# Patient Record
Sex: Male | Born: 1985 | Race: Black or African American | Hispanic: No | Marital: Single | Smoking: Current every day smoker
Health system: Southern US, Community
[De-identification: ages and names within clinical notes are randomized; demographics above are authoritative.]

## PROBLEM LIST (undated history)

## (undated) HISTORY — PX: HERNIA REPAIR: SHX51

---

## 2011-10-28 ENCOUNTER — Emergency Department: Payer: Self-pay | Admitting: Emergency Medicine

## 2013-10-18 IMAGING — CR DG CERVICAL SPINE - 1 VIEW
1 series · 1 of 1 positions shown · non-contrast
Comparison: none

REASON FOR EXAM: pain
COMMENTS:

PROCEDURE:     DXR - DXR CERVICAL SPINE ONE VIEW ONLY  - October 28, 2011  [DATE]
RESULT:     Comparison: None

[lat]
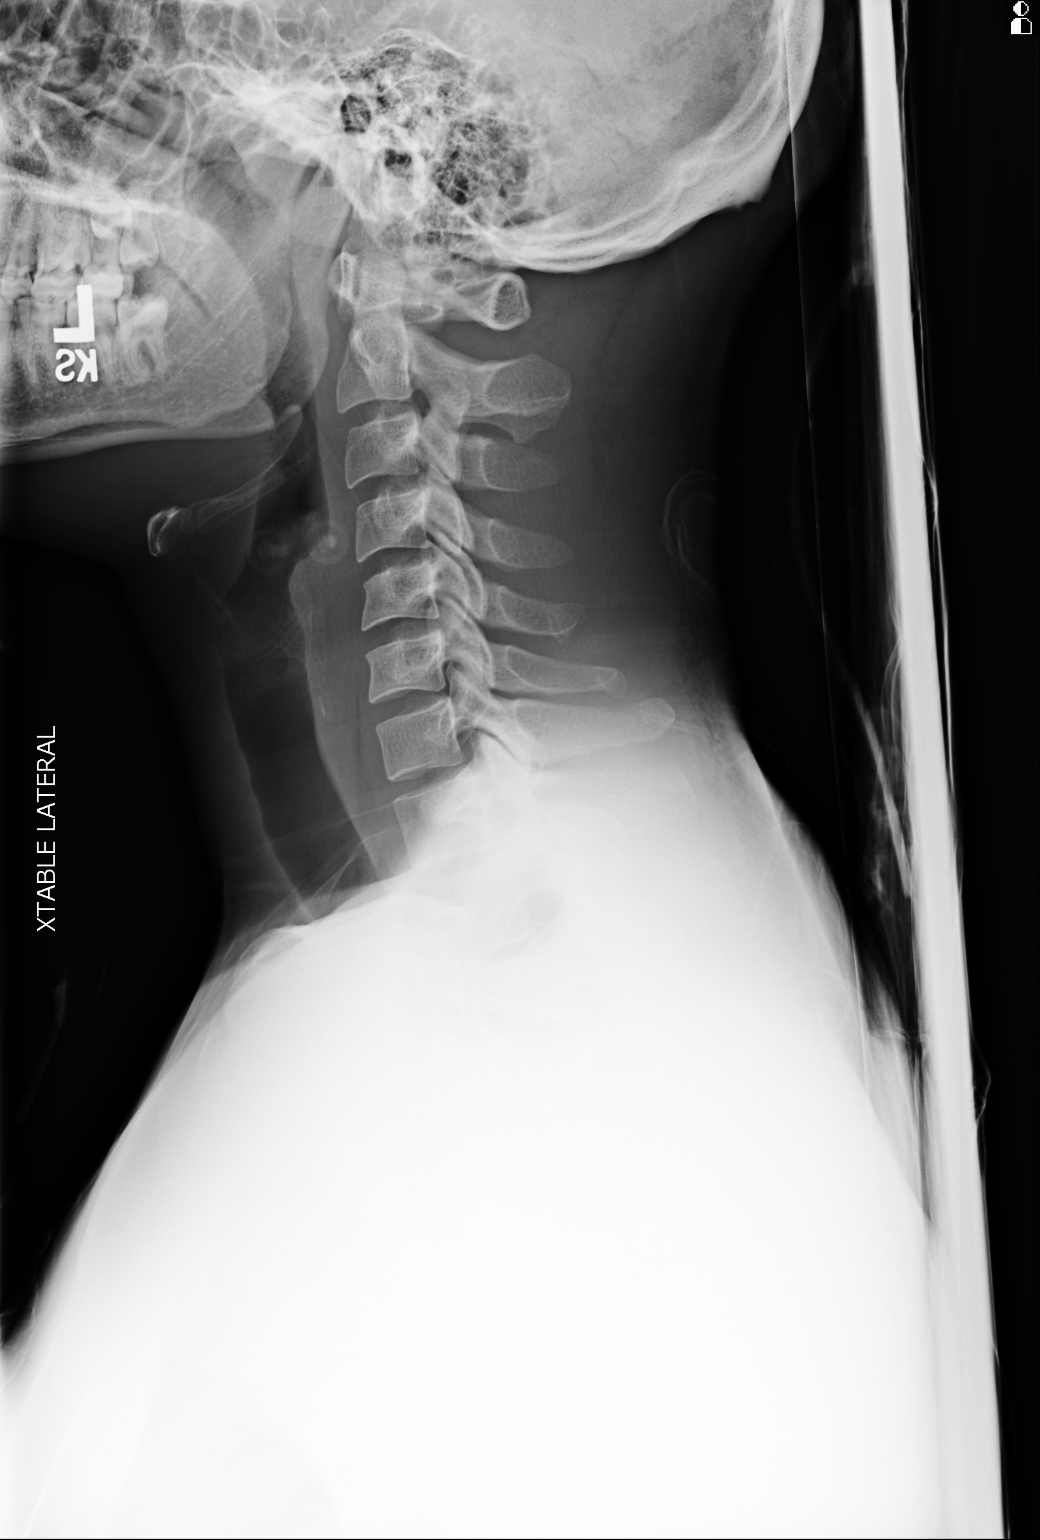

[1 of 1 positions shown; findings below may reference images not displayed]

FINDINGS: Crosstable lateral view of the cervical spine is provided.

The vertebral body heights are maintained. The alignment is anatomic. There
is no acute fracture or static listhesis. The disc spaces are maintained.
IMPRESSION: 1. No acute osseous abnormality of the cervical spine on a single crosstable
lateral view.

[REDACTED]

## 2013-10-18 IMAGING — CR DG LUMBAR SPINE 1V
1 series · 1 of 1 positions shown · non-contrast
Comparison: none

REASON FOR EXAM: pain
COMMENTS:

PROCEDURE:     DXR - DXR LUMBAR SPINE ONE VIEW ONLY  - October 28, 2011  [DATE]
RESULT:     Comparison: None

[lat]
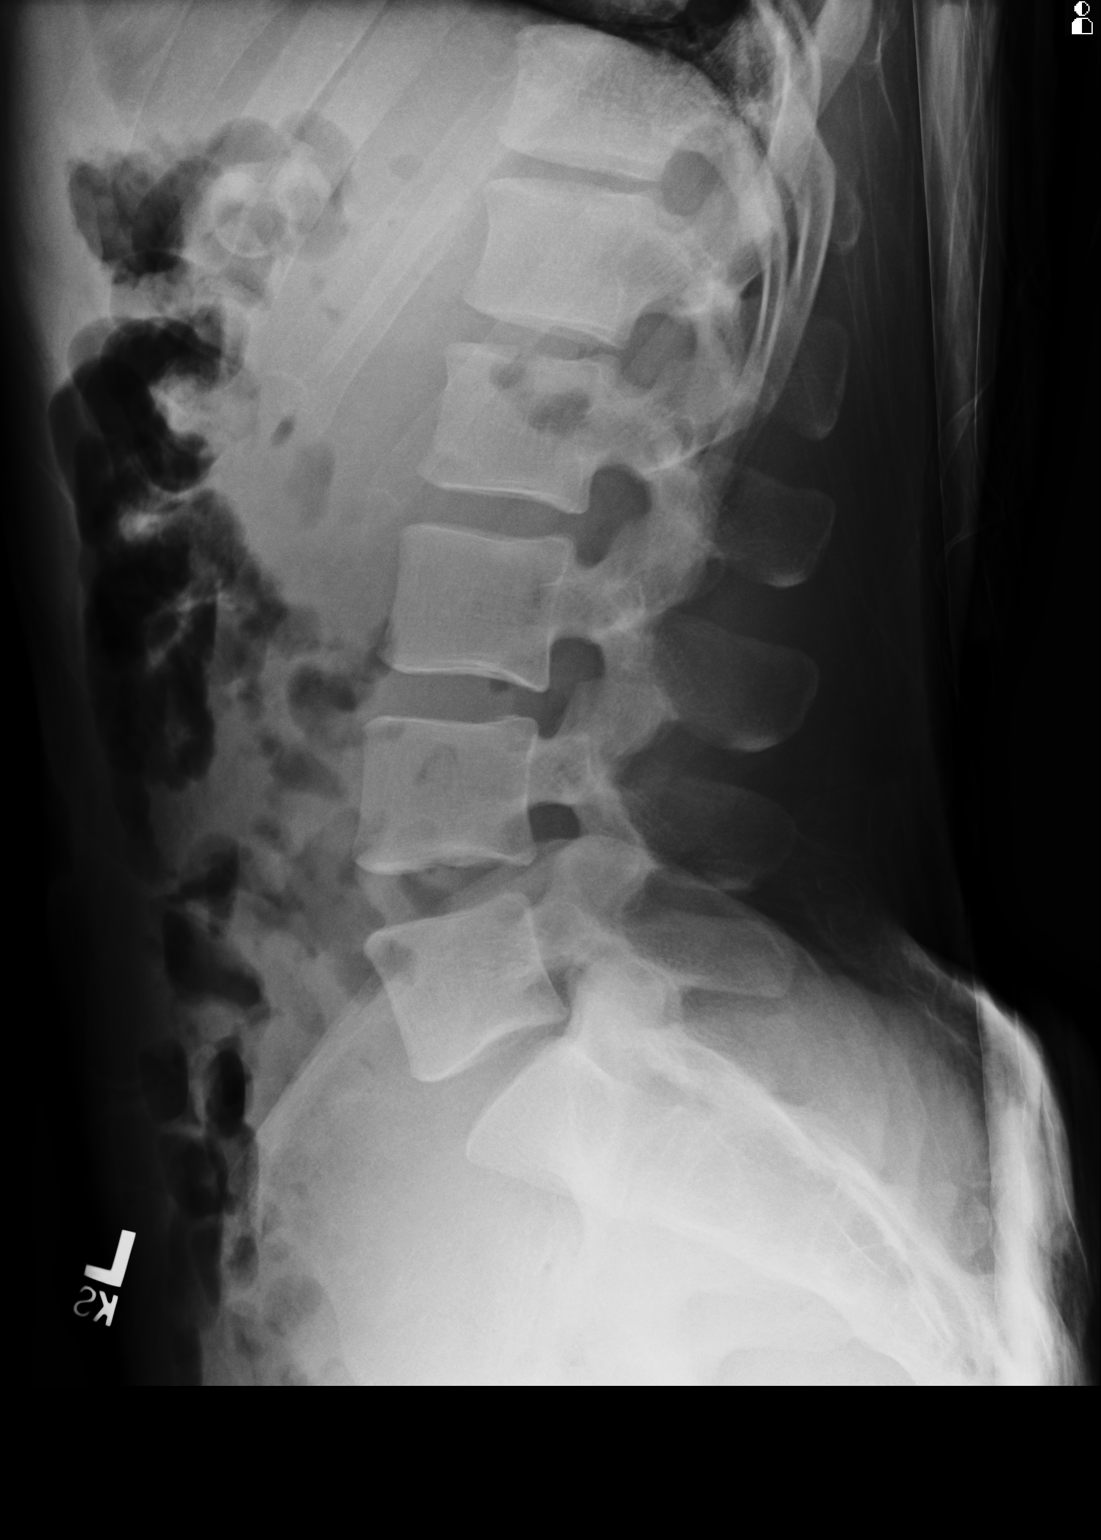

[1 of 1 positions shown; findings below may reference images not displayed]

FINDINGS: Crosstable lateral view of the lumbar spine is provided.

The vertebral body heights are maintained. The alignment is anatomic. There
is no spondylolysis. There is no acute fracture or static listhesis. The
disc spaces are maintained.
IMPRESSION: 1. No acute osseous abnormality of the lumbar spine on a single crosstable
lateral view.

[REDACTED]

## 2013-10-18 IMAGING — CR CERVICAL SPINE - COMPLETE 4+ VIEW
1 series · 7 of 7 positions shown · non-contrast
Comparison: None

REASON FOR EXAM: pain
COMMENTS:

PROCEDURE:     DXR - DXR CERVICAL SPINE COMPLETE  - October 28, 2011  [DATE]
RESULT:     History: Pain

[Series 1: lat · 0.17mm/px · 7 of 7 slices shown]
[im 1/7]
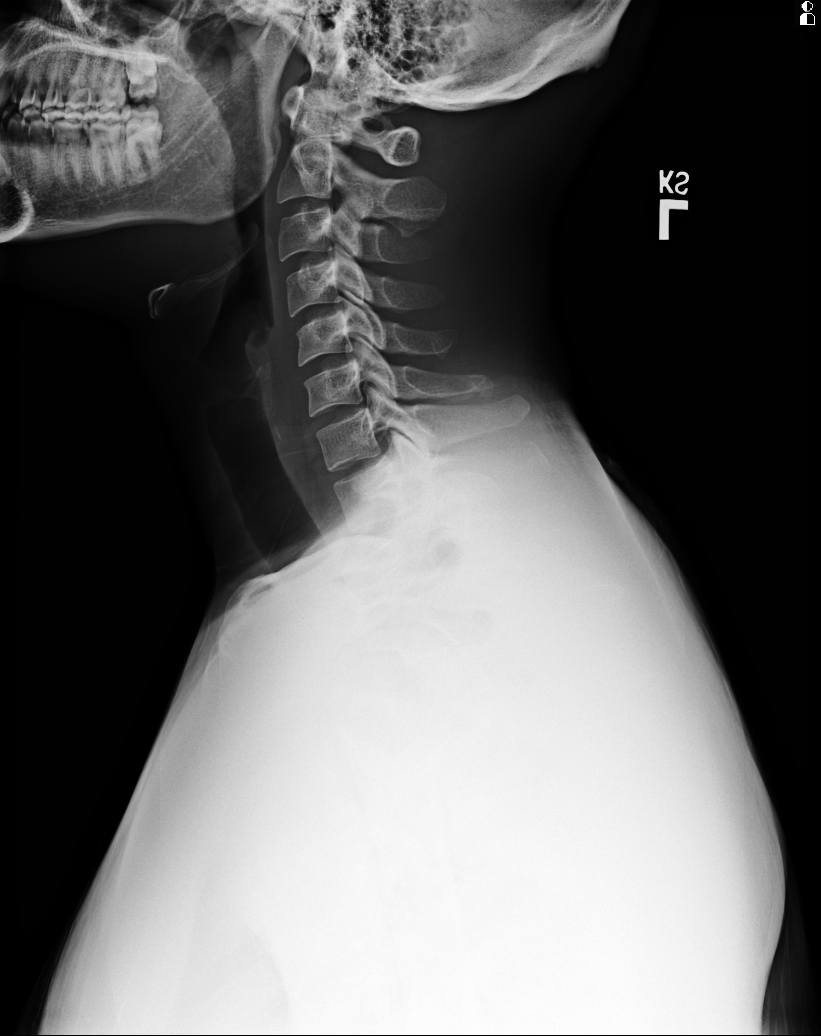
[im 2/7]
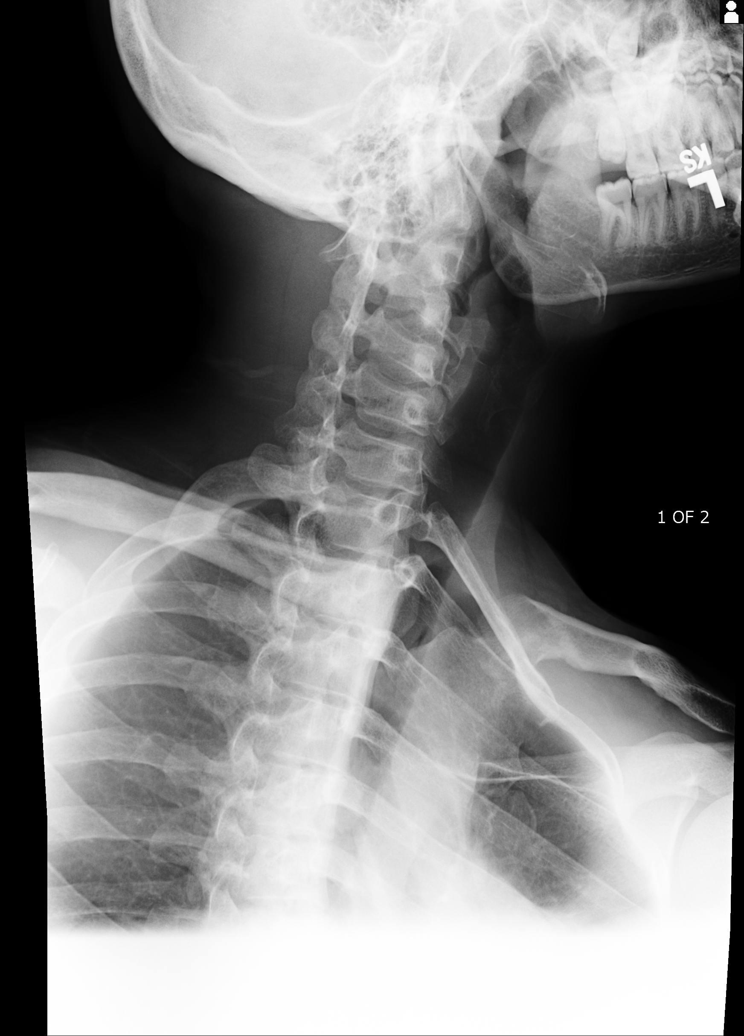
[im 3/7]
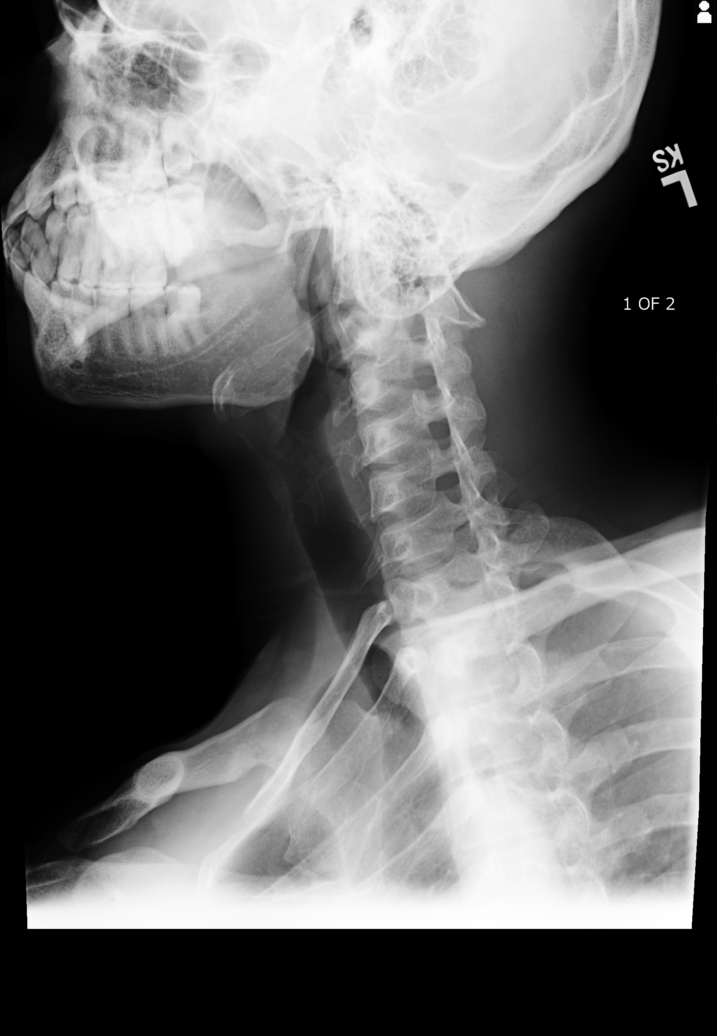
[im 4/7]
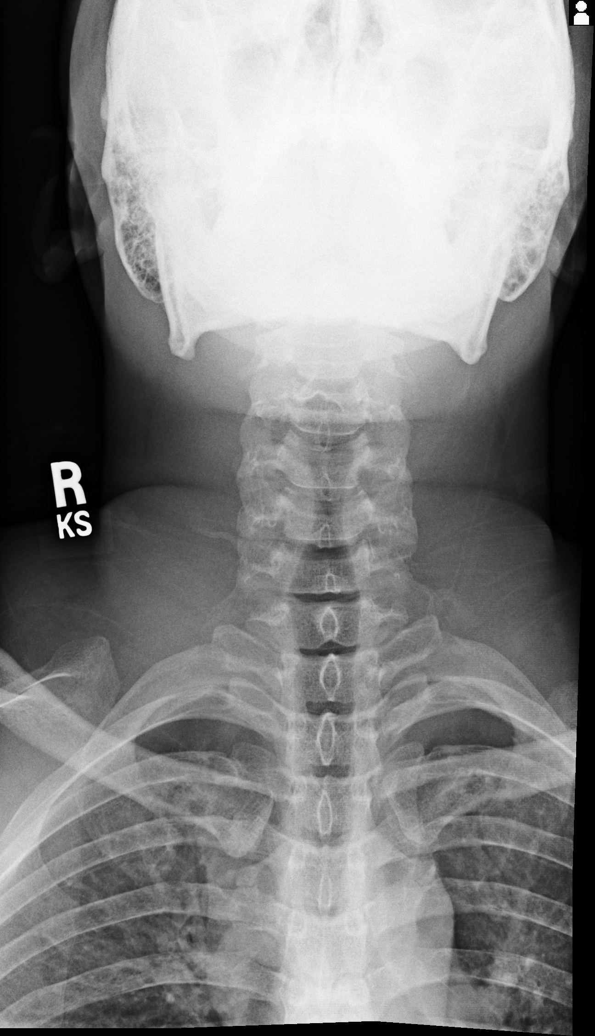
[im 5/7]
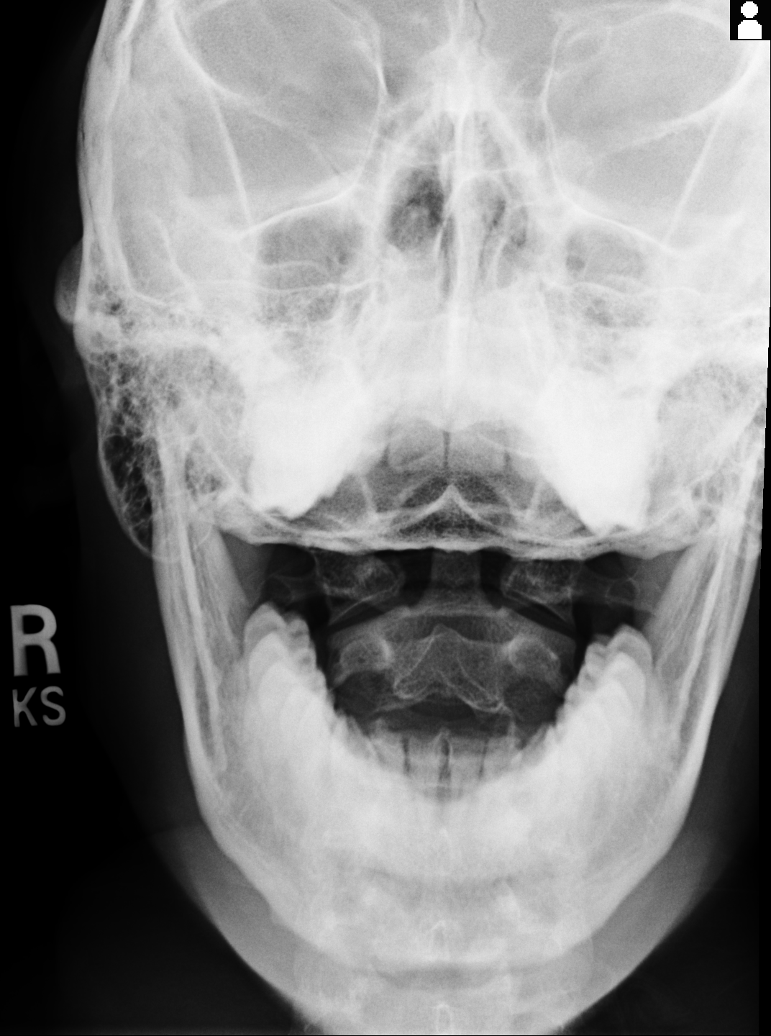
[im 6/7]
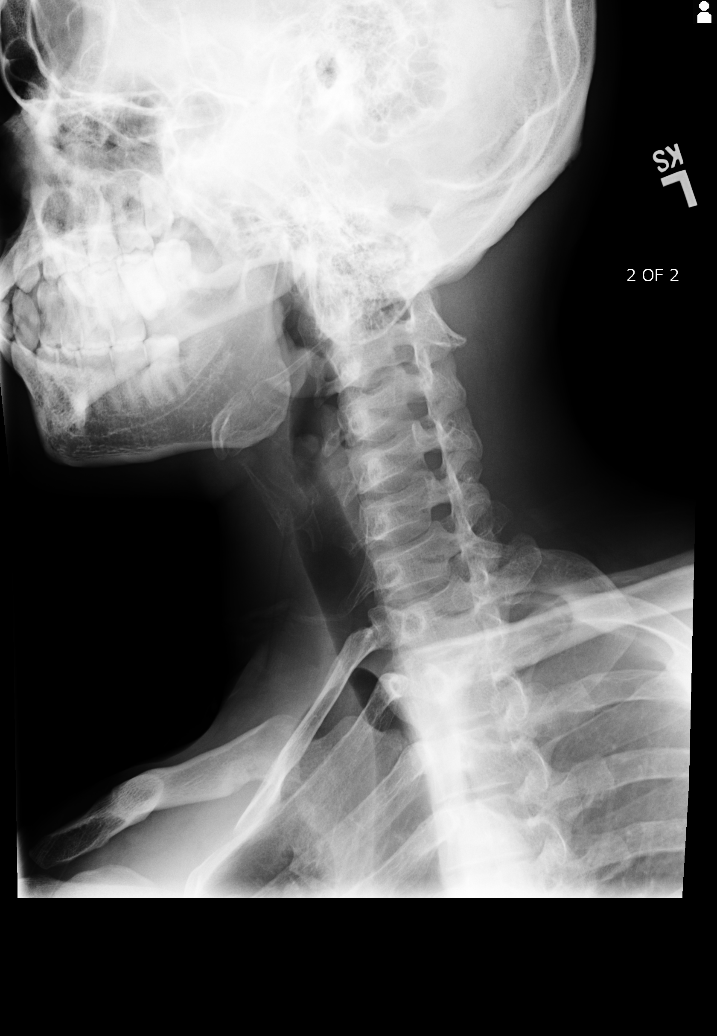
[im 7/7]
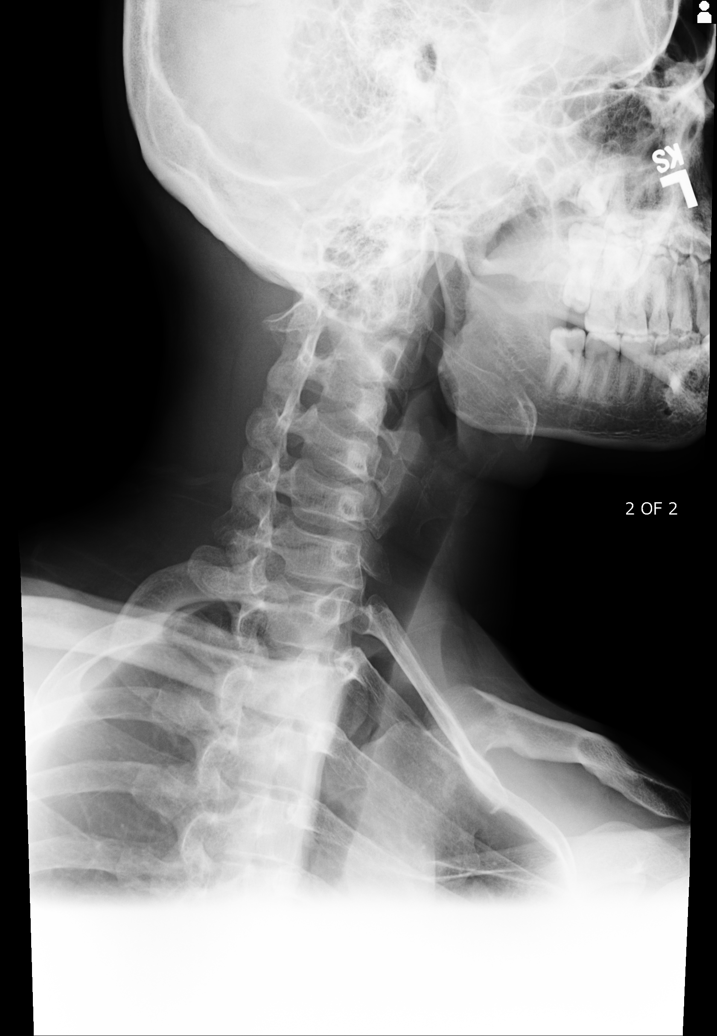

[7 of 7 positions shown; findings below may reference images not displayed]

FINDINGS: AP, lateral, bilateral oblique and odontoid views of the cervical spine are
provided.

The cervical spine is visualized to the level of C7-T1.

The vertebral body heights are maintained. The alignment is normal. The
prevertebral soft tissues are normal. There is no acute fracture or static
listhesis. The disc spaces are maintained. Bilateral neural foramina are
patent. The lung apices are clear.
IMPRESSION: No acute osseous injury of the cervical spine.

[REDACTED]

## 2020-04-11 ENCOUNTER — Other Ambulatory Visit: Payer: Self-pay

## 2020-04-11 ENCOUNTER — Ambulatory Visit: Payer: Self-pay | Admitting: Physician Assistant

## 2020-04-11 DIAGNOSIS — Z113 Encounter for screening for infections with a predominantly sexual mode of transmission: Secondary | ICD-10-CM

## 2020-04-11 LAB — GRAM STAIN

## 2020-04-11 NOTE — Progress Notes (Signed)
Gram stain reviewed and is negative today so no treatment needed for gram stain per standing order. Provider orders completed.

## 2020-04-12 ENCOUNTER — Encounter: Payer: Self-pay | Admitting: Physician Assistant

## 2020-04-12 NOTE — Progress Notes (Signed)
   [facility] Department STI clinic/screening visit  Subjective:  Austin Cline is a 34 y.o. male being seen today for an STI screening visit. The patient reports they do not have symptoms.    Patient has the following medical conditions:  There are no problems to display for this patient.    Chief Complaint  Patient presents with  . SEXUALLY TRANSMITTED DISEASE    screening    HPI  Patient reports that he is not having any symptoms but found out that a partner has had a partner that is HIV positive.  Reports that his last HIV test was 3-4 months ago and last void prior to sample collection for Gram stain was about 1 hr ago.   See flowsheet for further details and programmatic requirements.    The following portions of the patient's history were reviewed and updated as appropriate: allergies, current medications, past medical history, past social history, past surgical history and problem list.  Objective:  There were no vitals filed for this visit.  Physical Exam Constitutional:      General: He is not in acute distress.    Appearance: Normal appearance.  HENT:     Head: Normocephalic and atraumatic.     Comments: No nits,lice, or hair loss. No cervical, supraclavicular or axillary adenopathy.    Mouth/Throat:     Mouth: Mucous membranes are moist.     Pharynx: Oropharynx is clear. No oropharyngeal exudate or posterior oropharyngeal erythema.  Eyes:     Conjunctiva/sclera: Conjunctivae normal.  Pulmonary:     Effort: Pulmonary effort is normal.  Abdominal:     Palpations: Abdomen is soft. There is no mass.     Tenderness: There is no abdominal tenderness. There is no guarding or rebound.  Genitourinary:    Penis: Normal.      Testes: Normal.     Comments: Pubic area without nits, lice, hair loss, edema, erythema, lesions and inguinal adenopathy. Penis circumcised without rash, lesions and discharge at meatus. Musculoskeletal:     Cervical back: Neck  supple. No tenderness.  Skin:    General: Skin is warm and dry.     Findings: No bruising, erythema, lesion or rash.  Neurological:     Mental Status: He is alert and oriented to person, place, and time.  Psychiatric:        Mood and Affect: Mood normal.        Behavior: Behavior normal.        Thought Content: Thought content normal.        Judgment: Judgment normal.       Assessment and Plan:  Austin Cline is a 34 y.o. male presenting to the [facility] Department for STI screening  1. Screening for STD (sexually transmitted disease) Patient into clinic without symptoms. Rec condoms with all sex. Await test results.  Counseled that RN will call if needs to RTC for treatment once results are back. - Gram stain - Gonococcus culture - [facility] - Syphilis Serology, [facility]     Return for PRN.  No future appointments.  Matt Holmes, PA

## 2020-04-16 LAB — GONOCOCCUS CULTURE

## 2021-06-08 ENCOUNTER — Emergency Department (HOSPITAL_COMMUNITY)
Admission: EM | Admit: 2021-06-08 | Discharge: 2021-06-11 | Disposition: A | Discharge status: Home / Self Care | Payer: Self-pay | Attending: Emergency Medicine | Admitting: Emergency Medicine

## 2021-06-08 ENCOUNTER — Encounter (HOSPITAL_COMMUNITY): Payer: Self-pay | Admitting: Emergency Medicine

## 2021-06-08 DIAGNOSIS — Z20822 Contact with and (suspected) exposure to covid-19: Secondary | ICD-10-CM | POA: Insufficient documentation

## 2021-06-08 DIAGNOSIS — R45851 Suicidal ideations: Secondary | ICD-10-CM | POA: Insufficient documentation

## 2021-06-08 DIAGNOSIS — F332 Major depressive disorder, recurrent severe without psychotic features: Secondary | ICD-10-CM | POA: Insufficient documentation

## 2021-06-08 DIAGNOSIS — F1914 Other psychoactive substance abuse with psychoactive substance-induced mood disorder: Secondary | ICD-10-CM | POA: Insufficient documentation

## 2021-06-08 LAB — ACETAMINOPHEN LEVEL: Acetaminophen (Tylenol), Serum: <10 ug/mL — ABNORMAL LOW (ref 10–30)

## 2021-06-08 LAB — CBC
HCT: 51 % (ref 39.0–52.0)
Hemoglobin: 16.5 g/dL (ref 13.0–17.0)
MCH: 30.9 pg (ref 26.0–34.0)
MCHC: 32.4 g/dL (ref 30.0–36.0)
MCV: 95.5 fL (ref 80.0–100.0)
Platelets: 190 10*3/uL (ref 150–400)
RBC: 5.34 MIL/uL (ref 4.22–5.81)
RDW: 13.2 % (ref 11.5–15.5)
WBC: 4.4 10*3/uL (ref 4.0–10.5)
nRBC: 0 % (ref 0.0–0.2)

## 2021-06-08 LAB — COMPREHENSIVE METABOLIC PANEL
ALT: 18 U/L (ref 0–44)
AST: 17 U/L (ref 15–41)
Albumin: 4.2 g/dL (ref 3.5–5.0)
Alkaline Phosphatase: 47 U/L (ref 38–126)
Anion gap: 10 (ref 5–15)
BUN: 7 mg/dL (ref 6–20)
CO2: 25 mmol/L (ref 22–32)
Calcium: 9.2 mg/dL (ref 8.9–10.3)
Chloride: 104 mmol/L (ref 98–111)
Creatinine, Ser: 0.93 mg/dL (ref 0.61–1.24)
GFR, Estimated: >60 mL/min (ref >60)
Glucose, Bld: 92 mg/dL (ref 70–99)
Potassium: 3.3 mmol/L — ABNORMAL LOW (ref 3.5–5.1)
Sodium: 139 mmol/L (ref 135–145)
Total Bilirubin: 1.1 mg/dL (ref 0.3–1.2)
Total Protein: 7.6 g/dL (ref 6.5–8.1)

## 2021-06-08 LAB — RESP PANEL BY RT-PCR (FLU A&B, COVID) ARPGX2
Influenza A by PCR: NEGATIVE
Influenza B by PCR: NEGATIVE
SARS Coronavirus 2 by RT PCR: NEGATIVE

## 2021-06-08 LAB — SALICYLATE LEVEL: Salicylate Lvl: <7 mg/dL — ABNORMAL LOW (ref 7.0–30.0)

## 2021-06-08 LAB — ETHANOL: Alcohol, Ethyl (B): <10 mg/dL (ref <10)

## 2021-06-08 MED ORDER — LORAZEPAM 1 MG PO TABS: 1.0000 mg | ORAL_TABLET | ORAL | Status: DC | PRN

## 2021-06-08 MED ORDER — RISPERIDONE 1 MG PO TBDP: 2.0000 mg | ORAL_TABLET | Freq: Three times a day (TID) | ORAL | Status: DC | PRN

## 2021-06-08 MED ORDER — ZIPRASIDONE MESYLATE 20 MG IM SOLR
20.0000 mg | INTRAMUSCULAR | Status: DC | PRN
  Filled 2021-06-08: qty 20

## 2021-06-08 MED ORDER — RISPERIDONE 1 MG PO TBDP
1.0000 mg | ORAL_TABLET | Freq: Every day | ORAL | Status: DC
  Administered 2021-06-09: 1 mg via ORAL
  Filled 2021-06-08 (×4): qty 1

## 2021-06-08 MED ORDER — BENZTROPINE MESYLATE 1 MG PO TABS
1.0000 mg | ORAL_TABLET | Freq: Every day | ORAL | Status: DC
  Filled 2021-06-08 (×2): qty 1

## 2021-06-08 MED ORDER — ZIPRASIDONE MESYLATE 20 MG IM SOLR: 20.0000 mg | INTRAMUSCULAR | Status: DC | PRN

## 2021-06-08 MED ORDER — RISPERIDONE 1 MG PO TBDP
2.0000 mg | ORAL_TABLET | Freq: Every day | ORAL | Status: DC
  Filled 2021-06-08 (×2): qty 2

## 2021-06-08 MED ORDER — OLANZAPINE 5 MG PO TBDP: 10.0000 mg | ORAL_TABLET | Freq: Three times a day (TID) | ORAL | Status: DC | PRN

## 2021-06-08 MED ORDER — OXYCODONE-ACETAMINOPHEN 5-325 MG PO TABS
1.0000 | ORAL_TABLET | Freq: Once | ORAL | Status: AC
  Administered 2021-06-08: 1 via ORAL
  Filled 2021-06-08: qty 1

## 2021-06-08 MED ORDER — HYDROXYZINE HCL 25 MG PO TABS
25.0000 mg | ORAL_TABLET | Freq: Four times a day (QID) | ORAL | Status: DC | PRN
  Filled 2021-06-08: qty 1

## 2021-06-08 NOTE — BH Assessment (Signed)
@  1028, requested EDP (Dr. Lynelle Doctor) to notify staff, that TTS is ready to see patient. Requested [facility] staff to notify TTS when patient is ready to be seen.

## 2021-06-08 NOTE — Progress Notes (Signed)
Inpatient Behavioral Health Placement    Pt meets inpatient criteria per Elta Guadeloupe, NP. There are no appropriate beds at [facility] per [facility] Fransico Michael, RN.  Referral was sent to the following facilities;     Destination Service Provider Address Phone Fax  [facility]  [address], [place] [address] [phone]  [facility]  [address] [place] [address] [phone]  [facility] [address]  [address], [place] [address] [phone]  [facility]  [address] [place] [address] [phone]   [facility]  [address] [place] [address] [phone]  [facility]  [address] [place], [place] [address] [phone]  [facility]  [address] [place] [address] [phone]  [facility]  [address], [place] [address] [phone]  [facility]  [address] [place] [address] [phone]  [facility]  [address], [place] [address] [phone]  [facility]  [address] [place] [address] [phone]  [facility]  [address] [place] [address] [phone]  [facility]  [address] [place] [address] [phone]  [facility]  [address] [place] [address] [phone]  [facility]  [address] [place] [address] [phone]  [facility]  [address] [place], [place] [address] [phone]  [facility]  [address]  [place] [address] [phone]    Situation ongoing,  CSW will follow up.   Maryjean Ka, MSW, LCSWA 06/08/2021  @ 10:55 PM

## 2021-06-08 NOTE — ED Notes (Signed)
Did not attempt vital signs, pt is resting at this time.

## 2021-06-08 NOTE — ED Notes (Signed)
Security asked family to leave the lobby.

## 2021-06-08 NOTE — BH Assessment (Addendum)
@  1023, requested patient's nurse Arlina Robes, RN) and NT Alcario Drought) to set up the TTS machine for patient.

## 2021-06-08 NOTE — ED Notes (Signed)
Pt has been changed out and wanded by security 

## 2021-06-08 NOTE — ED Triage Notes (Signed)
Pt arrive to ED stating that he needs help. Pt states he has taken about 10 x pills over the past 24 hours. Pt states he drove himself to ED. Pt denies this as a suicide attempt.

## 2021-06-08 NOTE — ED Notes (Signed)
Family back in the hospital speaking with PD.

## 2021-06-08 NOTE — Progress Notes (Signed)
This NT/Sitter has been sitting at doorway to maintain observation on patient, he did try to whisper something to me about 12:00, I walked to bedside and patient then whispered "I love you" to me. He had not made any advances or said anything inappropriate to me prior to this. Patient was just observed masturbating under covers at first, then completely removed his boxers, exposed himself and continued to masturbate with door open in view of I and other staff members. I did move from directly in front of door as he refused to stop. Shortly after he just continued to watch television, did cover himself again with covers and is now sleeping and snoring. This NT did move back to doorway to continue to monitor patient.

## 2021-06-08 NOTE — ED Notes (Signed)
Pt care taken, laying in bed, no complaints at this time.

## 2021-06-08 NOTE — Progress Notes (Signed)
NT/Sitter for this patient today, patient was anxious on my arrival with questions about disposition. Was requesting shower, chap stick, and phone call, all of which were provided by RN. Patient was crying before and after phone call to fiance "Delilah" who he wanted to call and let her know where he was, as she would be very worried after not speaking to patient last night. He only spoke to her for a very short time before becoming frustrated saying "I am not about to talk about all that right now, I am not trying to do that". Patient then asked staff to speak to her and let her know what was going on with him, phone was given to RN and then back to patient to say goodbye. Patient is now in bed, crying, visibly upset and very anxious. He looks angry and very hurt inside, as though he has some deep pain that he is trying hard to push down. Much more pain than anger is being displayed though. This NT/ Sitter asked why/what was going on while reassuring patient that we are here for support and comfort, to which patient would only respond by shaking his head no. He is also preocoupied with being clean, washing hands, cleaning himself, and room. He wanted to clean bath/shower room before he used it, and refused to step on the floor of bathroom, asking for a trashbag to step on instead of stepping on floor. Has also been saying some things under his breath that is easy to overhear ( I am not gone die, someone else died, I am trying get out of here, I am ready to go, I need to go outside and smoke) some of the things he has been saying. Very restless with leg or foot shaking much of the time he has been in bed. He has been cooperative and pleasant otherwise, just very anxious and deeply sad.
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
## 2021-06-08 NOTE — ED Notes (Signed)
Pt very cooperative with COVID swab. Pt refused medication.  States he does not want to feel down and out.  He states he "wanting to get off the shit, and not get on more shit."  This nurse explained to pt that he was being treated bc he was upset and needed assistance.  Pt refused medication until he talks to a provider for more information.

## 2021-06-08 NOTE — BH Assessment (Signed)
TTS Assessment in progress 

## 2021-06-08 NOTE — BH Assessment (Signed)
@  1021, requested patient's nurse Jeronimo Greaves, RN) to set up the TTS machine for patient.

## 2021-06-08 NOTE — ED Notes (Signed)
Pts GF in lobby with pts brother wanting to see pt.  Reinforced pt is not allowed visitors at this time.  Family questioned why, this nurse explained pt becomes upset when he talks to them and pt does not want to talk to family.

## 2021-06-08 NOTE — Progress Notes (Signed)
Patient sleeping soundly, was much more calm after eating dinner and speaking with fiance. He did say thank you when handing back the phone, and quietly watched television until falling asleep about one hour ago.

## 2021-06-08 NOTE — ED Provider Notes (Signed)
Patient was seen by psychiatry.  Inpatient treatment recommended   Dorie Rank, MD 06/08/21 1320

## 2021-06-08 NOTE — Progress Notes (Signed)
Patient woke up, asking for snack, extremely agitated and making threats about leaving hospital. Patient asks questions, demands answers about when he will be able to leave. Did remind patient that he will not be released, and needs to be admitted for treatment. Patient continues to mutter under his breath, talking out loud, but quietly about how he will not be staying here and needs to leave and go to work. Patient stated that "I am missing too much work, yall need to let me the f*uck up out of here now, you won't be here when my bills are due, where will you be then?" Patient then said he had better get some answers now. However, when I asked who exactly he wanted to speak to and what his questions were, he refused to answer or making any eye contact. Patient kept sighing and making sounds of frustration, eventually punching the metal (sheet/divider?) that is pulled down in [facility] ED rooms as he was angry about his disposition. Gave patient some space and he is attempting to calm himself down, though he has few if any coping skills. Patient asked again if he could leave and this NT told patient very clearly that Doctor recommended inpatient treatment. Upon hearing this, patient broke down sobbing, crying harder than he has all day. He is adamant that it is his right to know exactly how many days the process would take, and I tried to answer as best I could but he was not satisfied with the answer. I did explain that we are concerned for him and that we are trying to help him. He then said "I am not worried about that, I am worried about my fiance.' Patient then asked if he could make a phone call to his fiance Delilah to check on her, did advise that this would be his third and final phone call today, patient was agreeable. Patient is currently speaking with girlfriend, mostly calm but sometimes tearful, saying "they are trying to send me away" and crying heavily. Appears to be starting to calm down.

## 2021-06-08 NOTE — ED Notes (Signed)
Mother wanting to speak with pt.  Pt speaking with mother.  Pt became upset following conversation.

## 2021-06-08 NOTE — ED Notes (Signed)
Pt punching the wall at this time, this nurse at bedside security called.

## 2021-06-08 NOTE — ED Notes (Signed)
Gave pt word search puzzles per pt request. As well, snacks to eat per pt request.

## 2021-06-08 NOTE — ED Notes (Signed)
Refused medication at 0940.  Will continue to encourage.

## 2021-06-08 NOTE — ED Notes (Signed)
Pts mother called for additional updates requesting to speak to Austin Cline, who refused to speak with his mother.  Mother updated.

## 2021-06-08 NOTE — ED Notes (Signed)
Girlfriend called pt spoke with her, pt became very upset when he spoke with the GF.  Reminded pt he has 3 phone calls per day.  Pt said we can update family.
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
## 2021-06-08 NOTE — Progress Notes (Signed)
[facility] is reviewing and requested items listed below:   At 11:21 pm CSW call from [facility] [phone] who is currently reviewing this pt and has requesting the following information: Home medication list, urine drug screen, and IVC paperwork. CSW spoke with [facility] from intake.   CSW updated care team via secure chat: Regan Lemming, RN, Fransico Michael, RN, Laveda Abbe, NP, Damita Dunnings, [place] [facility], [facility] Malva Limes, RN, Megan Salon, RN.   First shift CSW will need to assist and follow up on these requested items. First shift CSW is already in chat and listed above in the care team members.  Maryjean Ka, MSW, [facility] 06/08/2021 11:35 PM

## 2021-06-08 NOTE — ED Notes (Signed)
Pt taking a shower and brushing his teeth.  Pt cooperative and polite.

## 2021-06-08 NOTE — ED Notes (Signed)
Pt back to sleep at this time.

## 2021-06-08 NOTE — ED Provider Notes (Signed)
[facility] EMERGENCY DEPARTMENT Provider Note   CSN: [id number] Arrival date & time: 06/08/21  0142     History  Chief Complaint  Patient presents with   Drug Overdose    Austin Cline is a 36 y.o. male.  Patient presents to the emergency department stating "I need help".  Patient admits to taking 10 ecstasy pills over the last 24 hours.  When asked if this was an attempt to harm or kill himself, he reports "to an extent".  Patient tearful and refusing to answer any further questions.      Home Medications Prior to Admission medications   Not on File      Allergies    Patient has no known allergies.    Review of Systems   Review of Systems  Unable to perform ROS: Psychiatric disorder   Physical Exam Updated Vital Signs BP (!) 145/105 (BP Location: Right Arm)    Pulse 90    Temp 97.9 F (36.6 C) (Oral)    Resp 18    Ht 6' (1.829 m)    Wt 81.6 kg    SpO2 99%    BMI 24.41 kg/m  Physical Exam Vitals and nursing note reviewed.  Constitutional:      General: He is not in acute distress.    Appearance: Normal appearance. He is well-developed.  HENT:     Head: Normocephalic and atraumatic.     Right Ear: Hearing normal.     Left Ear: Hearing normal.     Nose: Nose normal.  Eyes:     Conjunctiva/sclera: Conjunctivae normal.     Pupils: Pupils are equal, round, and reactive to light.  Cardiovascular:     Rate and Rhythm: Regular rhythm.     Heart sounds: S1 normal and S2 normal. No murmur heard.   No friction rub. No gallop.  Pulmonary:     Effort: Pulmonary effort is normal. No respiratory distress.     Breath sounds: Normal breath sounds.  Chest:     Chest wall: No tenderness.  Abdominal:     General: Bowel sounds are normal.     Palpations: Abdomen is soft.     Tenderness: There is no abdominal tenderness. There is no guarding or rebound. Negative signs include Murphy's sign and McBurney's sign.     Hernia: No hernia is present.  Musculoskeletal:         General: Normal range of motion.     Cervical back: Normal range of motion and neck supple.  Skin:    General: Skin is warm and dry.     Findings: No rash.  Neurological:     Mental Status: He is alert and oriented to person, place, and time.     GCS: GCS eye subscore is 4. GCS verbal subscore is 5. GCS motor subscore is 6.     Cranial Nerves: No cranial nerve deficit.     Sensory: No sensory deficit.     Coordination: Coordination normal.  Psychiatric:        Mood and Affect: Mood is depressed. Affect is tearful.        Speech: Speech is delayed.        Behavior: Behavior is withdrawn.    ED Results / Procedures / Treatments   Labs (all labs ordered are listed, but only abnormal results are displayed) Labs Reviewed  COMPREHENSIVE METABOLIC PANEL - Abnormal; Notable for the following components:      Result Value   Potassium 3.3 (*)  All other components within normal limits  SALICYLATE LEVEL - Abnormal; Notable for the following components:   Salicylate Lvl [id number] (*)    All other components within normal limits  ACETAMINOPHEN LEVEL - Abnormal; Notable for the following components:   Acetaminophen (Tylenol), Serum <10 (*)    All other components within normal limits  CBC  ETHANOL  RAPID URINE DRUG SCREEN, HOSP PERFORMED    EKG EKG Interpretation  Date/Time:  Thursday June 08 2021 03:37:21 EST Ventricular Rate:  90 PR Interval:  138 QRS Duration: 110 QT Interval:  356 QTC Calculation: 435 R Axis:   -10 Text Interpretation: Normal sinus rhythm Normal ECG No previous ECGs available Confirmed by Orpah Greek [phone]) on 06/08/2021 3:43:55 AM  Radiology No results found.  Procedures Procedures    Medications Ordered in ED Medications  oxyCODONE-acetaminophen (PERCOCET/ROXICET) 5-325 MG per tablet 1 tablet (1 tablet Oral Given 06/08/21 0425)    ED Course/ Medical Decision Making/ A&P                           Medical Decision Making Amount and/or  Complexity of Data Reviewed Labs: ordered.  Risk Prescription drug management.   Presents to the emergency department with what sounds like an intentional overdose of ecstasy.  Patient took approximately 10 doses over 24-hour period.  He was initially evasive but then became tearful and admitted that this was secondary to depression in an attempt to harm himself.  He was not cooperative initially and therefore IVC was initiated.  He has been cooperative since then.  Vital signs are stable, he is awake and alert.  He is medically clear for psychiatric treatment.        Final Clinical Impression(s) / ED Diagnoses Final diagnoses:  Suicidal ideation    Rx / DC Orders ED Discharge Orders     None         Salvadore Valvano, Gwenyth Allegra, MD 06/08/21 [phone]

## 2021-06-09 LAB — RAPID URINE DRUG SCREEN, HOSP PERFORMED
Amphetamines: POSITIVE — AB
Barbiturates: NOT DETECTED
Benzodiazepines: NOT DETECTED
Cocaine: NOT DETECTED
Opiates: NOT DETECTED
Tetrahydrocannabinol: POSITIVE — AB

## 2021-06-09 NOTE — ED Notes (Signed)
Pt agitated and restless, yelling.

## 2021-06-09 NOTE — ED Notes (Signed)
[facility] Department at bedside.

## 2021-06-09 NOTE — ED Notes (Signed)
Pt filled his food tray over, agitated wandering in hall stating he is not going back in room. Pt with threatening behaviors towards nursing staff, refusing to return to room

## 2021-06-09 NOTE — Consult Note (Signed)
Austin Cline is a 36 year old male with no documented past psychiatric history who initially presented voluntarily to [facility] 06/08/21 with possible intentional overdose as suicide attempt.   Upon admission patient has become increasingly agitated with some psychotic behaviors requiring medication. Provider attempted to see patient for assessment several times during the day; patient remains asleep after receiving prn medications for agitation.  Per chart review:  - EDRN note 06/09/21 0758: "Pt wanded into hall naked to walk into bathroom, pt escorted back to room by security and [employer]".  -EDRN note 06/09/21 0826: "Pt becoming increasingly  agitated and pacing the floors. Pt at this time in room  masturbating naked. Security called."  -EDRN note 06/09/21 4166: "Pt agitated and restless,  yelling".  -EDRN note 05/2021 0847: "Pt filled his food tray over,  Agitated wandering in hall stating he is not going back in  room. Pt with threatening behaviors towards nursing staff,  refusing to return to room."  -EDRN note 06/09/2021 1444: "Pt sleeping at this time. Vital  signs will be updated once patient is alert."    Plan:   -Continue:    -Risperidone 1 mg daily   -Benztropine 1 mg HS   -Hydroxyzine 25 mg TID prn anxiety   -Continue to recommend inpatient psychiatric hospitalization for  Further observation, stabilization, and treatment.

## 2021-06-09 NOTE — ED Notes (Addendum)
Pt sitting at nurses station within 4 feet on RN yelling and agitated, refusing to go to room. Pt refusing to go to room until he speaks to his girlfriend, stating "Y'all don't know my record, I will fuck y'all up." Girlfriend called and placed on speaker phone trying to calm patient. Patient willingly returned to room after speaking with girlfriend.

## 2021-06-09 NOTE — ED Notes (Signed)
Family updated as to patient's status.

## 2021-06-09 NOTE — Progress Notes (Signed)
Patient has been denied by [facility] due to no appropriate beds. Patient meets [facility] inpatient per Elta Guadeloupe, NP. Patient has been faxed out to the following facilities:    [facility]  [address] [place] [address] [phone]  [facility]  [address] [place] [address] [phone]  [facility] [address]  [address] [place] [address] [phone]  [facility]  [address] [place] [address] [phone]  [facility]  [address] [place] [address] [phone]  [facility]  [address] [place], [place] [address] [phone]  [facility]  [address] [place] [address] [phone]  [facility]  [address] [place] [address] [phone]  [facility]  [address] [place] [address] [phone]  [facility]  [address] [place] [address] [phone]  [facility]  [address], [place] [address] [phone]  [facility]  [address] [place] [address] [phone]  [facility]  [address] [place] [address] [phone]  [facility]  [address] [place] [address] [phone]  [facility]  [address], [place] [address] [phone]  [facility]  [address] [place], [place] [address] [phone]  [facility]  [address], [place] [address] [phone]   Damita Dunnings, MSW, LCSW-A  3:37 PM 06/09/2021

## 2021-06-09 NOTE — Progress Notes (Signed)
Inpatient Behavioral Health Placement   Pt meets inpatient criteria per Elta Guadeloupe, NP. There are no appropriate beds at [facility]. Referral was sent to the following facilities;   [facility]  [address], [place] [address] [phone]  [facility]  [address] [place] [address] [phone]  [facility]  [address] [place] [address] [phone]  [facility]  [address] [place] [address] [phone]  [facility]  [address] [place] [address] [phone]    Situation ongoing,  CSW will follow up.   Maryjean Ka, MSW, LCSWA 06/09/2021  @ 10:12 PM

## 2021-06-09 NOTE — ED Notes (Signed)
Pt upset and requesting to leave. Informed patient he can not leave because he is under IVC.

## 2021-06-09 NOTE — ED Notes (Signed)
Pt sleeping at this time. Vital signs will be updated once patient is alert.

## 2021-06-09 NOTE — ED Notes (Signed)
Pt becoming increasingly agitated and pacing the floors. Pt at this time in room masturbating naked. Security called

## 2021-06-09 NOTE — ED Notes (Signed)
Discussed with patient for 20 minutes about IVC papers and process. Pt refusing medication but then agreed to take it. Pt states he took a bunch of medications which made him have ups and downs in his behavior. Explained to patient policies and procedures. Pt agitated but agreed to take medication at this time. [facility] remains at bedside.

## 2021-06-09 NOTE — ED Notes (Signed)
Pt wanded into hall naked to walk into bathroom, pt escorted back to room by security and [employer].

## 2021-06-10 MED ORDER — GABAPENTIN 300 MG PO CAPS
300.0000 mg | ORAL_CAPSULE | Freq: Three times a day (TID) | ORAL | Status: DC
  Filled 2021-06-10 (×2): qty 1

## 2021-06-10 NOTE — ED Notes (Signed)
Pt out of shower and back to room

## 2021-06-10 NOTE — [facility] Counselor (Signed)
RE-ASSESSMENT  Pt presented to [facility] voluntarily on 06/08/2021 reporting he had taken a whole lot of E pills, Shrooms, and Cocaine". Patient was asked if he was trying to harm himself by using the drugs, he initially shrugs his shoulders with not response. He was asked again if he was trying to harm himself and says, "Well, I know what I was doing, I know what could have happened to me." He also reported homicidal ideation stating, I want to fuck some motha fucka's up right now. He identifies The DA, the police officer that lied on me, my lawyer, it's a lot of people. He has a plan to slap people. While in the ED he was agitated, punching walls, walking around naked, and masturbating. He was evaluated by psychiatry and inpatient treatment was recommended. Pt was placed under involuntary commitment.  Today, per ED record, Pt has been more appropriate but continues to be agitated, irritable, pacing, and focused on being discharged home. He has refused medications. During assessment, Pt says he wants to be discharged. He denies current suicidal ideation. When asked if he was feeling suicidal when he arrived, Pt says "I'm not going to say that." When asked if he was still having thoughts of harming people, Pt replies "I know what I need to be doing." When asked to clarify, Pt says he is not having homicidal thoughts. He denies auditory or visual hallucinations. Pt states he needs to return home because he has financial responsibilities. He says he lives with his girlfriend and says she is worried about him to the point she cannot sleep. Pt says he is willing to participate in outpatient treatment.  Gave clinical report to Cecilio Asper, NP who recommended Pt be observed overnight and evaluated by psychiatry in the morning to determine if Pt can be released from IVC.   Pamalee Leyden, [facility], [facility] Triage Specialist [phone]

## 2021-06-10 NOTE — ED Notes (Signed)
Pt sister, Duwayne Heck, called to speak to pt. Pt offered to speak to his sister--However, pt stated "I do not want to speak with her"

## 2021-06-10 NOTE — ED Notes (Signed)
Pt completed repeat TTS Consult with [facility] T[facility] Counselor at this time. Pt ambulated to restroom following repeat TTS.

## 2021-06-10 NOTE — ED Notes (Addendum)
While out of room to nurses station for CN to speak to GF, patient proceeds to sit on end of hallway 8 bed. Patient verbalized multiple times "you mother fuckers are about to make catch a charge while I'm here." States this loudly at least three times for staff to hear. Officer and security at NS, no response to patient noted. Patient then re-directed back to room by CN. Unknown what patient is upset about.

## 2021-06-10 NOTE — ED Notes (Signed)
Upon this RNs shift assessment, Pt resting comfortably in bed. Pt calm and cooperative with this RN. Pt alert and oriented. Pt expressing desire to return home to girlfriend. Pt does report he has bills to pay and he still has responsibilities to handle that he cannot do while he is here. Pt does not present at this time under the influence of any substances. [employer] sitting at bedside from previous shift.  Pt reports he drove himself here because he was under the influence of multiple substances and wanted help. Pt reports when he sobered up he was IVC'd and was agitated at the process. Pt denies any SI/HI to this RN at this time. Pt denies A/V hallucinations to this RN at this time.

## 2021-06-10 NOTE — ED Notes (Signed)
Pt resting comfortably. Pt refusing PO Meds at this time. Pt calm and cooperative, but reports not needing any medications.

## 2021-06-10 NOTE — ED Notes (Signed)
Pt. Girlfriend is visiting

## 2021-06-10 NOTE — ED Notes (Signed)
Pt on the phone with girlfriend.Pt given phone to RN to explain process of IVC. IVC process and BH process explained to family. Family verbalized understanding.

## 2021-06-10 NOTE — ED Notes (Signed)
Pt talking with another patient in hall stating "Don't change into those scrubs, it's a scam." Pt becoming more agitated and irritable.

## 2021-06-10 NOTE — ED Notes (Signed)
Pt threw his entire breakfast tray away and stated he wanted a ham sandwich

## 2021-06-10 NOTE — ED Notes (Signed)
[employer] at bedside. Pt upset and agitated, stating "I came in here voluntarily, I need to leave. They made a decision when I was high and decided I needed to be kept."

## 2021-06-10 NOTE — ED Notes (Signed)
Pt requested to use the phone. I informed the pt that he could have 3 phone calls a day for 5 minutes each. Pt began to get agitated and stated "what about my calls yesterday, I definitely did not get 3 calls yesterday"

## 2021-06-10 NOTE — ED Notes (Signed)
Pt girlfriend left at this time. Pt calm and cooperative at this time.

## 2021-06-10 NOTE — ED Notes (Signed)
Pt remaining calm and cooperative with ED Staff at this time. Pt updated and aware he will be spending the night for further observation in the ED this evening. Pt agreeable at this time. [employer] signed-off on requiring bedside observation at this time.

## 2021-06-10 NOTE — ED Notes (Signed)
Pt refused medication twice by 2 different nurses. Explained to pt the importance of taking medication. Pt is calm, but not cooperative. MD made aware, no orders at this time.

## 2021-06-10 NOTE — ED Notes (Signed)
Pt is requesting another TTS. Pt states "I want to go home". Made contact with pt's LCAS, as well as Tia Alert, NP. NP states pt will have another TTS when available, until then pt is to remain IVC.

## 2021-06-10 NOTE — ED Notes (Signed)
Pt starting to pace and become agitated. [employer] officer on duty notified Sheriffs Department

## 2021-06-10 NOTE — ED Notes (Signed)
Pt in appropriate bathroom taking a shower

## 2021-06-10 NOTE — ED Provider Notes (Signed)
Emergency Medicine Observation Re-evaluation Note  Austin Cline is a 36 y.o. male, seen on rounds today.  Pt initially presented to the ED for complaints of Drug Overdose Currently, the patient is calm no distress.  Physical Exam  BP 122/81 (BP Location: Right Arm)    Pulse 64    Temp 98.2 F (36.8 C) (Oral)    Resp 20    Ht 1.829 m (6')    Wt 81.6 kg    SpO2 99%    BMI 24.41 kg/m  Physical Exam General: Awake, resting in bed Cardiac: Regular rate Lungs: Breathing easily Psych: Normal mood  ED Course / MDM  EKG:EKG Interpretation  Date/Time:  Thursday June 08 2021 03:37:21 EST Ventricular Rate:  90 PR Interval:  138 QRS Duration: 110 QT Interval:  356 QTC Calculation: 435 R Axis:   -10 Text Interpretation: Normal sinus rhythm Normal ECG No previous ECGs available Confirmed by Gilda Crease [phone]) on 06/08/2021 3:43:55 AM  I have reviewed the labs performed to date as well as medications administered while in observation.  Recent changes in the last 24 hours include continued attempts to find inpatient bed.  Patient noted to have episodes of being agitated and restless.  Plan  Current plan is for inpatient treatment. Austin Cline is under involuntary commitment.      Austin Dibbles, MD 06/10/21 [phone]

## 2021-06-10 NOTE — ED Notes (Signed)
Pt resting comfortably at this time and sleeping.

## 2021-06-11 MED ORDER — STERILE WATER FOR INJECTION IJ SOLN
INTRAMUSCULAR | Status: AC
  Filled 2021-06-11: qty 10

## 2021-06-11 NOTE — Consult Note (Signed)
Telepsych Consultation   Reason for Consult:  IVC Referring Physician:  [employer] Location of Patient: [facility] Location of Provider: Other: [facility]- TTS  Patient Identification: Austin Cline MRN:  [id number] Principal Diagnosis: <principal problem not specified> Diagnosis:  Active Problems:   * No active hospital problems. *   Total Time spent with patient: 15 minutes  Subjective:   Austin Cline is a 36 y.o. male was seen and evaluated via tele assessment.  He was under involuntary commitment due to taking 10 ecstasy pills.  he is awake, alert and oriented x3.  Denying suicidal or homicidal ideations.  Denies auditory or visual hallucinations. He presented irritable and slightly guarded.  [place] reported "I was partying too hard and made stupid mistake."  Denied previous inpatient admissions.  Denied that he has been followed by therapy and/or psychiatry in the past.  Provided verbal authorization to follow-up with his girlfriend whom he resides with.  NP spoke to [employer] at [phone].  She denied any safety concerns with patient returning home states " he is never done anything like this before."   HPI:  "Austin Cline is a 36 y.o. male. States that he droved himself to the Emergency Department because I was fucked up. Also, reports that he was experimenting with hard drugs and alcohol. Patient further reports using a whole lot of E pills, Shrooms, and Cocaine". Patient was asked if he was trying to harm himself by using the drugs, he initially shrugs his shoulders with not response. He was asked again if he was trying to harm himself and says, "Well, I know what I was doing, I know what could have happened to me.".  Just left out of another hospital patient with current suicidal ideations. Stats that he had a plan to commit suicide yesterday by using an excessive amount of substances. Current suicidal thoughts were triggered by the loss of a cousin by overdose. The cousin's death was  2018/06/09. He acknowledges of prior prior suicide attempts. Clinician asked for details of those suicide attempts and patient states, I don't want to talk about those attempts, lets focus on the current situation.     Past Psychiatric History:   Risk to Self:   Risk to Others:   Prior Inpatient Therapy:   Prior Outpatient Therapy:    Past Medical History: History reviewed. No pertinent past medical history. History reviewed. No pertinent surgical history. Family History: History reviewed. No pertinent family history. Family Psychiatric  History:  Social History:  Social History   Substance and Sexual Activity  Alcohol Use Yes   Comment: occasionally     Social History   Substance and Sexual Activity  Drug Use Not Currently   Types: Marijuana, MDMA Chiropodist)    Social History   Socioeconomic History   Marital status: Single    Spouse name: Not on file   Number of children: Not on file   Years of education: Not on file   Highest education level: Not on file  Occupational History   Not on file  Tobacco Use   Smoking status: Every Day    Types: Cigars   Smokeless tobacco: Never  Substance and Sexual Activity   Alcohol use: Yes    Comment: occasionally   Drug use: Not Currently    Types: Marijuana, MDMA (Ecstacy)   Sexual activity: Yes    Partners: Female  Other Topics Concern   Not on file  Social History Narrative   Not on file   Social  Determinants of Health   Financial Resource Strain: Not on file  Food Insecurity: Not on file  Transportation Needs: Not on file  Physical Activity: Not on file  Stress: Not on file  Social Connections: Not on file   Additional Social History:    Allergies:  No Known Allergies  Labs: No results found for this or any previous visit (from the past 48 hour(s)).  Medications:  Current Facility-Administered Medications  Medication Dose Route Frequency Provider Last Rate Last Admin   benztropine (COGENTIN) tablet 1 mg   1 mg Oral QHS Laveda AbbeParks, Laurie Britton, NP       gabapentin (NEURONTIN) capsule 300 mg  300 mg Oral TID Leevy-Johnson, Brooke A, NP       hydrOXYzine (ATARAX) tablet 25 mg  25 mg Oral Q6H PRN Linwood DibblesKnapp, Jon, MD       OLANZapine zydis (ZYPREXA) disintegrating tablet 10 mg  10 mg Oral Q8H PRN Laveda AbbeParks, Laurie Britton, NP       And   LORazepam (ATIVAN) tablet 1 mg  1 mg Oral PRN Laveda AbbeParks, Laurie Britton, NP       And   ziprasidone (GEODON) injection 20 mg  20 mg Intramuscular PRN Laveda AbbeParks, Laurie Britton, NP       risperiDONE (RISPERDAL M-TABS) disintegrating tablet 1 mg  1 mg Oral Daily Laveda AbbeParks, Laurie Britton, NP   1 mg at 06/09/21 [id number]   And   risperiDONE (RISPERDAL M-TABS) disintegrating tablet 2 mg  2 mg Oral QHS Laveda AbbeParks, Laurie Britton, NP       sterile water (preservative free) injection            No current outpatient medications on file.    Musculoskeletal: Strength & Muscle Tone: within normal limits Gait & Station: normal Patient leans: N/A          Psychiatric Specialty Exam:  Presentation  General Appearance: Appropriate for Environment Eye Contact:Good Speech:Clear and Coherent Speech Volume:Normal Handedness:Right  Mood and Affect  Mood:Anxious; Depressed Affect:Congruent  Thought Process  Thought Processes:Coherent Descriptions of Associations:Intact  Orientation:Full (Time, Place and Person)  Thought Content:Logical  History of Schizophrenia/Schizoaffective disorder:No  Duration of Psychotic Symptoms:No data recorded Hallucinations:Hallucinations: None  Ideas of Reference:None  Suicidal Thoughts:Suicidal Thoughts: No  Homicidal Thoughts:Homicidal Thoughts: No   Sensorium  Memory:Immediate Good; Recent Good; Remote Good Judgment:Fair Insight:Fair  Executive Functions  Concentration:Fair Attention Span:Fair Recall:Fair Fund of Knowledge:Good Language:Good  Psychomotor Activity  Psychomotor Activity:Psychomotor Activity: Normal  Assets   Assets:Desire for Improvement; Manufacturing systems engineerCommunication Skills; Physical Health  Sleep  Sleep:Sleep: Fair   Physical Exam: Physical Exam Vitals reviewed.  Neurological:     Mental Status: He is alert and oriented to person, place, and time.  Psychiatric:        Attention and Perception: Attention and perception normal.        Mood and Affect: Mood normal.        Speech: Speech normal.        Behavior: Behavior is agitated.        Thought Content: Thought content normal.        Cognition and Memory: Cognition normal.        Judgment: Judgment normal.   Review of Systems  Eyes: Negative.   Genitourinary: Negative.   Endo/Heme/Allergies: Negative.   Psychiatric/Behavioral:  Positive for substance abuse. Negative for depression and suicidal ideas. The patient is not nervous/anxious.   All other systems reviewed and are negative. Blood pressure 123/77, pulse 81, temperature 98.8 F (37.1 C),  temperature source Oral, resp. rate 16, height 6' (1.829 m), weight 81.6 kg, SpO2 98 %. Body mass index is 24.41 kg/m.   Disposition: No evidence of imminent risk to self or others at present.   Patient does not meet criteria for psychiatric inpatient admission. Supportive therapy provided about ongoing stressors. Refer to IOP. Discussed crisis plan, support from social network, calling 911, coming to the Emergency Department, and calling Suicide Hotline.  This service was provided via telemedicine using a 2-way, interactive audio and video technology.  Names of all persons participating in this telemedicine service and their role in this encounter. Name:  Verdon Ferrante  Role: patient   Name: T.Melvyn Neth  Role: Nurse Practitioner           Oneta Rack, NP 06/11/2021 12:49 PM

## 2021-06-11 NOTE — Discharge Instructions (Signed)
Substance Abuse Resources   [facility] center in [place], [place] Address: [address] [place], [place] [address] Phone: [phone]   [place] Addiction treatment center in [place], [place]  Address: [address], [address] [place], [place] [address] Phone: [phone]  [facility]  Address: [place], [place], [place] [address] Phone: [phone]  This service includes individual, group, and family treatment, life skills and illness management training, case management, and assistance with accessing resources in the community, though with much greater intensity than provided in [place]. Given the potential severity of needs at admission to [facility], the goal of group is to help members achieve substance-free status or decrease in use/needs of such significance that they can step-down into SAIOP for continued  [place] (Detox) Facility Based Crisis:  These are 3 locations for services: Please call before arrival:    [place] [facility])  Address: [address] [place], [place] [address] Phone: [phone]  [facility]) Address: [address] [place], [place] [address] Phone#: [phone]  [facility]) Address: [address] [place], [place] [address] Phone#: [phone]   [facility] ([facility]): (offers outpatient therapy and intensive outpatient substance abuse therapy).  [address], [place], [place] [address] Phone: [phone]  [place]:   Phone: [phone]  [facility] Intensive Outpatient Program [facility]) means structured individual and group addiction activities and services that are provided at an outpatient program designed to assist adult and adolescent consumers to begin recovery and learn  skills for recovery maintenance. The [place] program is offered at least 3 hours a day, 3 days a week. SAIOP services shall include a structured program consisting of, but not limited to, the following services: Individual counseling and support; Group counseling and support; Family counseling, training or support; Biochemical assays to identify recent drug use (e.g., urine drug screens); Strategies for relapse prevention to include community and social support systems in treatment; Life skills; Crisis contingency planning; Disease Management; and Treatment support activities that have been adapted or specifically designed for persons with physical disabilities, or persons with co-occurring disorders of mental illness and substance abuse/dependence or mental retardation/developmental disability and substance abuse/dependence.  Phone: [facility])  Address: [place], [place], [place] [address] Phone: [phone]   [place] Address: [address] [place], [place] [address] Phone: [phone]  - a combination of group and individual sessions to meet the participants needs. This allows participants to engage in treatment and remain involved in their home and work life. - Transitional housing places program participants in a supportive living environment while they complete a treatment program and work to secure independent housing. - The Substance Abuse Intensive Outpatient Treatment Program at [employer] consists of structured group sessions and individual sessions that are designed to teach participants early recovery and relapse prevention skills. -[facility] works with the [employer] to provide a housing and treatment program for homeless veterans.   [facility] [employer].   Address: [address] [place],  [address] Phone#: [phone]   : Referrals to RTSA  facilities can be made by [facility] and [facility].  Referrals are also accepted from physicians, private providers, hospital emergency rooms, family members, or any person  who has knowledge of someone in the need of our services.   The [facility] 24-Hour Call Center: [phone] Behavioral Health Crisis Line: [phone]

## 2021-06-11 NOTE — ED Notes (Addendum)
Nurse tech notified this nurse that pt was asking when he would be discharged home. This nurse educated patient on IVC process and behavioral health's recommendation of possible inpatient tx and reevaluation today. Pt became hostile yelling "man I ain't doing this shit no more, I'm leaving." Pt began to stand up and get closer to this nurse, this nurse felt unsafe and pt came out to nurses station yelling "fuck this shit I am not staying here another day" and began running out the door. Security and [employer]  police department notified.

## 2021-06-11 NOTE — ED Notes (Signed)
Reciended papers sent to [facility].

## 2021-06-11 NOTE — Progress Notes (Signed)
CSW provided the following resources the patient to utilize upon discharge to connect to the appropriate resources:   Substance Abuse Resources   [facility] [place], [place] Address: [address] [place], [place] [address] Phone: [phone]   [facility] Addiction treatment center in [place], [place]  Address: [address], [address], [place], [place] [address] Phone: [phone]  [facility]  Address: [address] [place], [place], [place] [address] Phone: [phone]  This service includes individual, group, and family treatment, life skills and illness management training, case management, and assistance with accessing resources in the community, though with much greater intensity than provided in SAIOP. Given the potential severity of needs at admission to [facility], the goal of group is to help members achieve substance-free status or decrease in use/needs of such significance that they can step-down into SAIOP for continued  [facility] Facility Based Crisis:  These are 3 locations for services: Please call before arrival:    [facility] Recovery Facility Based Crisis [facility])  Address: [address] [place], [place] [address] Phone: [phone]  [facility] Recovery Facility Based Crisis [facility]) Address: [address] [place], [place] [address] Phone#: [phone]  [facility] Recovery Facility Based Crisis [facility]) Address: [address] [place], [place] [address] Phone#: [phone]   [facility]): (offers outpatient therapy and intensive outpatient substance abuse therapy).  [address], [place], [place] [address] Phone: [phone]  [facility]:   Phone: [phone]  [facility] Intensive Outpatient Program [facility]) means structured individual and group addiction activities and services  that are provided at an outpatient program designed to assist adult and adolescent consumers to begin recovery and learn skills for recovery maintenance. The [facility] SAIOP program is offered at least 3 hours a day, 3 days a week. SAIOP services shall include a structured program consisting of, but not limited to, the following services: Individual counseling and support; Group counseling and support; Family counseling, training or support; Biochemical assays to identify recent drug use (e.g., urine drug screens); Strategies for relapse prevention to include community and social support systems in treatment; Life skills; Crisis contingency planning; Disease Management; and Treatment support activities that have been adapted or specifically designed for persons with physical disabilities, or persons with co-occurring disorders of mental illness and substance abuse/dependence or mental retardation/developmental disability and substance abuse/dependence.  Phone: [phone]   [facility])  Address: [address] [place], [place], [place] [address] Phone: [phone]   [facility] Address: [address] [place], [place] [address] Phone: [phone]  - a combination of group and individual sessions to meet the participants needs. This allows participants to engage in treatment and remain involved in their home and work life. - Transitional housing places program participants in a supportive living environment while they complete a treatment program and work to secure independent housing. - The Substance Abuse Intensive Outpatient Treatment Program at [employer] consists of structured group sessions and individual sessions that are designed to teach participants early recovery and relapse prevention skills. -[facility] works with the [employer] to provide a housing and treatment program for homeless veterans.   [facility], physiological, [employer].   Address: [address] [place], [place] [address] Phone#: [phone]   : Referrals to RTSA facilities can be made by [employer] and  [facility].  Referrals are also accepted from physicians, private providers, hospital emergency rooms, family members, or any person who has knowledge of someone in the need of our services.   [facility] 24-Hour Call Center: [phone] Behavioral Health Crisis Line: [phone]  Crissie Reese, MSW, LCSW-A, [place] Phone: [phone] Disposition/TOC

## 2021-06-11 NOTE — ED Notes (Signed)
Went to attempt to give geodon shot to patient, pt stating "If you get near me with that shot, they gonna have to tase me. I'm gonna catch some fucking charges." [employer] and Security remains at bedside. Sheriffs Department unwillingly to help administer geodon to patient after threats made to staff. Pt stating "I want to talk to the fucking doctor." EDP called to bedside in attempts to calm patient. EDP updated on situation, ok to attempt to hold Geodon at this time.

## 2021-06-11 NOTE — ED Notes (Signed)
Pt. On phone with GF stated " when I get out of these cuffs I'll give them a reason to get me out of here"

## 2021-06-11 NOTE — ED Notes (Addendum)
Pt continues to be agitated and making threats at nursing staff.

## 2021-06-11 NOTE — ED Provider Notes (Signed)
Emergency Medicine Observation Re-evaluation Note  Austin Cline is a 36 y.o. male, seen on rounds today.  Pt initially presented to the ED for complaints of Drug Overdose Currently, the patient is sleeping.  Physical Exam  BP 123/77 (BP Location: Right Arm)    Pulse 81    Temp 98.8 F (37.1 C) (Oral)    Resp 16    Ht 6' (1.829 m)    Wt 81.6 kg    SpO2 98%    BMI 24.41 kg/m  Physical Exam General: Nondistressed Cardiac: Extremities well perfused Lungs: Breathing is even and unlabored Psych: Deferred  ED Course / MDM  EKG:EKG Interpretation  Date/Time:  Thursday June 08 2021 03:37:21 EST Ventricular Rate:  90 PR Interval:  138 QRS Duration: 110 QT Interval:  356 QTC Calculation: 435 R Axis:   -10 Text Interpretation: Normal sinus rhythm Normal ECG No previous ECGs available Confirmed by Gilda Crease [phone]) on 06/08/2021 3:43:55 AM  I have reviewed the labs performed to date as well as medications administered while in observation.  Recent changes in the last 24 hours include evaluation by TTS last night.  Plan is for reevaluation this morning.  Plan  Current plan is for reevaluation this morning. Austin Cline is under involuntary commitment.      Gloris Manchester, MD 06/11/21 [phone]

## 2021-06-11 NOTE — ED Notes (Addendum)
Attempted to give pt geodon per PRN orders for agitation. This nurse did not feel safe to give geodon based on pts threat "man if you get near me with that shot the police better be ready to use their taser". Sheriffs officer at bedside refused to hold pt to give shot. IT consultant remains at bedside.

## 2021-12-05 ENCOUNTER — Emergency Department (HOSPITAL_COMMUNITY): Payer: Self-pay

## 2021-12-05 ENCOUNTER — Emergency Department (HOSPITAL_COMMUNITY)
Admission: EM | Admit: 2021-12-05 | Discharge: 2021-12-05 | Discharge status: Against Medical Advice | Payer: Self-pay | Attending: Emergency Medicine | Admitting: Emergency Medicine

## 2021-12-05 ENCOUNTER — Other Ambulatory Visit: Payer: Self-pay

## 2021-12-05 DIAGNOSIS — M25521 Pain in right elbow: Secondary | ICD-10-CM | POA: Insufficient documentation

## 2021-12-05 DIAGNOSIS — M545 Low back pain, unspecified: Secondary | ICD-10-CM | POA: Diagnosis present

## 2021-12-05 DIAGNOSIS — Y92410 Unspecified street and highway as the place of occurrence of the external cause: Secondary | ICD-10-CM | POA: Insufficient documentation

## 2021-12-05 DIAGNOSIS — S39012A Strain of muscle, fascia and tendon of lower back, initial encounter: Secondary | ICD-10-CM | POA: Insufficient documentation

## 2021-12-05 DIAGNOSIS — Z5321 Procedure and treatment not carried out due to patient leaving prior to being seen by health care provider: Secondary | ICD-10-CM | POA: Diagnosis not present
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
## 2021-12-05 NOTE — ED Notes (Signed)
Patient asked this tech how many people are ahead of him. This tech told him 9 people checked in before him but I'm not sure how long hell be waiting. Patient then started screaming and cussing, security then went over to talk to patient and patient said "fuck the police and fuck this place". Security then escorted this patient off the campus.
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
## 2021-12-05 NOTE — ED Notes (Signed)
This patient became aggressive with staff, stating he needed to see a doctor right away and hes not leaving until he does. Security was then called and patient then started yelling in security's face. This tech was then telling security that he cant just be seen over everyone else and he has to wait and cant just get discharge paperwork right away. Patient overheard and screamed "fuck you bitch and the discharge papers"

## 2021-12-06 ENCOUNTER — Emergency Department (HOSPITAL_COMMUNITY): Payer: Self-pay

## 2021-12-06 ENCOUNTER — Other Ambulatory Visit: Payer: Self-pay

## 2021-12-06 ENCOUNTER — Encounter (HOSPITAL_COMMUNITY): Payer: Self-pay | Admitting: Emergency Medicine

## 2021-12-06 ENCOUNTER — Emergency Department (HOSPITAL_COMMUNITY)
Admission: EM | Admit: 2021-12-06 | Discharge: 2021-12-06 | Disposition: A | Discharge status: Home / Self Care | Payer: Self-pay | Source: Home / Self Care | Attending: Emergency Medicine | Admitting: Emergency Medicine

## 2021-12-06 DIAGNOSIS — M25521 Pain in right elbow: Secondary | ICD-10-CM

## 2021-12-06 DIAGNOSIS — S39012A Strain of muscle, fascia and tendon of lower back, initial encounter: Secondary | ICD-10-CM

## 2021-12-06 MED ORDER — KETOROLAC TROMETHAMINE 30 MG/ML IJ SOLN
30.0000 mg | Freq: Once | INTRAMUSCULAR | Status: AC
  Administered 2021-12-06: 30 mg via INTRAMUSCULAR

## 2021-12-06 MED ORDER — KETOROLAC TROMETHAMINE 30 MG/ML IJ SOLN
30.0000 mg | Freq: Once | INTRAMUSCULAR | Status: DC
  Filled 2021-12-06: qty 1

## 2021-12-06 MED ORDER — METHOCARBAMOL 500 MG PO TABS: 500.0000 mg | ORAL_TABLET | Freq: Two times a day (BID) | ORAL | 0 refills | Status: AC

## 2021-12-06 MED ORDER — METHOCARBAMOL 500 MG PO TABS
500.0000 mg | ORAL_TABLET | Freq: Once | ORAL | Status: AC
Start: 2021-12-06 — End: 2021-12-06
  Administered 2021-12-06: 500 mg via ORAL
  Filled 2021-12-06: qty 1

## 2021-12-06 MED ORDER — MELOXICAM 15 MG PO TABS: 15.0000 mg | ORAL_TABLET | Freq: Every day | ORAL | 0 refills | Status: AC
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
## 2022-05-01 ENCOUNTER — Encounter (HOSPITAL_COMMUNITY): Payer: Self-pay | Admitting: *Deleted

## 2022-05-01 ENCOUNTER — Ambulatory Visit (HOSPITAL_COMMUNITY)
Admission: EM | Admit: 2022-05-01 | Discharge: 2022-05-01 | Disposition: A | Discharge status: Home / Self Care | Payer: Self-pay | Attending: Family Medicine | Admitting: Family Medicine

## 2022-05-01 DIAGNOSIS — H02843 Edema of right eye, unspecified eyelid: Secondary | ICD-10-CM

## 2022-05-01 DIAGNOSIS — H0289 Other specified disorders of eyelid: Secondary | ICD-10-CM

## 2022-05-01 DIAGNOSIS — H02846 Edema of left eye, unspecified eyelid: Secondary | ICD-10-CM

## 2022-05-01 DIAGNOSIS — M79622 Pain in left upper arm: Secondary | ICD-10-CM

## 2022-05-01 MED ORDER — PREDNISONE 20 MG PO TABS: 40.0000 mg | ORAL_TABLET | Freq: Every day | ORAL | 0 refills | Status: DC

## 2022-05-01 NOTE — ED Triage Notes (Signed)
Pt states he woke up this morning with his eyes red and swollen. The he tried to go to work today but he complains of his left hand and arm was swollen. He donated plasma yesterday that's the only thing he can think that he did differently but it was not his first time. He denies any SOB.
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
## 2022-10-27 ENCOUNTER — Other Ambulatory Visit: Payer: Self-pay

## 2022-10-27 ENCOUNTER — Emergency Department (HOSPITAL_COMMUNITY)
Admission: EM | Admit: 2022-10-27 | Discharge: 2022-10-27 | Disposition: A | Discharge status: Home / Self Care | Payer: Self-pay | Attending: Emergency Medicine | Admitting: Emergency Medicine

## 2022-10-27 DIAGNOSIS — A599 Trichomoniasis, unspecified: Secondary | ICD-10-CM

## 2022-10-27 DIAGNOSIS — Z202 Contact with and (suspected) exposure to infections with a predominantly sexual mode of transmission: Secondary | ICD-10-CM

## 2022-10-27 MED ORDER — DOXYCYCLINE HYCLATE 100 MG PO TABS
100.0000 mg | ORAL_TABLET | Freq: Once | ORAL | Status: AC
  Administered 2022-10-27: 100 mg via ORAL
  Filled 2022-10-27: qty 1

## 2022-10-27 MED ORDER — METRONIDAZOLE 500 MG PO TABS
2000.0000 mg | ORAL_TABLET | Freq: Once | ORAL | Status: AC
  Administered 2022-10-27: 2000 mg via ORAL
  Filled 2022-10-27: qty 4

## 2022-10-27 MED ORDER — STERILE WATER FOR INJECTION IJ SOLN
INTRAMUSCULAR | Status: AC
  Administered 2022-10-27: 1 mL
  Filled 2022-10-27: qty 10

## 2022-10-27 MED ORDER — DOXYCYCLINE HYCLATE 100 MG PO CAPS: 100.0000 mg | ORAL_CAPSULE | Freq: Two times a day (BID) | ORAL | 0 refills | Status: AC

## 2022-10-27 MED ORDER — CEFTRIAXONE SODIUM 500 MG IJ SOLR
500.0000 mg | Freq: Once | INTRAMUSCULAR | Status: AC
  Administered 2022-10-27: 500 mg via INTRAMUSCULAR
  Filled 2022-10-27: qty 500

## 2022-10-27 NOTE — ED Notes (Addendum)
Patient drank water and swallowed pills with no difficulties.

## 2022-10-27 NOTE — ED Notes (Signed)
Patient educated to take full dose of antibiotic.

## 2022-10-27 NOTE — ED Provider Notes (Signed)
[place] EMERGENCY DEPARTMENT AT [facility] Provider Note   CSN: [id number] Arrival date & time: 10/27/22  0149     History  Chief Complaint  Patient presents with   STD screening    Austin Cline is a 37 y.o. male.  The history is provided by the patient.  Exposure to STD This is a new problem. The problem occurs constantly. The problem has not changed since onset.Pertinent negatives include no chest pain, no abdominal pain, no headaches and no shortness of breath. Nothing aggravates the symptoms. Nothing relieves the symptoms. He has tried nothing for the symptoms. The treatment provided no relief.  Patient was informed by a sexual partner that she has trichomonas and that he needs to be treated.       Home Medications Prior to Admission medications   Medication Sig Start Date End Date Taking? Authorizing Provider  doxycycline (VIBRAMYCIN) 100 MG capsule Take 1 capsule (100 mg total) by mouth 2 (two) times daily. One po bid x 7 days 10/27/22  Yes Lamarion Mcevers, MD  meloxicam (MOBIC) 15 MG tablet Take 1 tablet (15 mg total) by mouth daily. 12/06/21   Mesner, Barbara Cower, MD  methocarbamol (ROBAXIN) 500 MG tablet Take 1 tablet (500 mg total) by mouth 2 (two) times daily. 12/06/21   Mesner, Barbara Cower, MD  predniSONE (DELTASONE) 20 MG tablet Take 2 tablets (40 mg total) by mouth daily. 05/01/22   Mardella Layman, MD      Allergies    Patient has no known allergies.    Review of Systems   Review of Systems  Constitutional:  Negative for fever.  HENT:  Negative for facial swelling.   Respiratory:  Negative for shortness of breath.   Cardiovascular:  Negative for chest pain.  Gastrointestinal:  Negative for abdominal pain.  Neurological:  Negative for headaches.  All other systems reviewed and are negative.   Physical Exam Updated Vital Signs BP (!) 149/108   Pulse 68   Temp 98 F (36.7 C) (Oral)   Resp 16   SpO2 98%  Physical Exam Vitals and nursing note reviewed.   Constitutional:      General: He is not in acute distress.    Appearance: He is well-developed. He is not diaphoretic.  HENT:     Head: Normocephalic and atraumatic.     Nose: Nose normal.  Eyes:     Conjunctiva/sclera: Conjunctivae normal.     Pupils: Pupils are equal, round, and reactive to light.  Cardiovascular:     Rate and Rhythm: Normal rate and regular rhythm.  Pulmonary:     Effort: Pulmonary effort is normal.     Breath sounds: Normal breath sounds. No wheezing or rales.  Abdominal:     General: Bowel sounds are normal.     Palpations: Abdomen is soft.     Tenderness: There is no abdominal tenderness. There is no guarding or rebound.  Musculoskeletal:        General: Normal range of motion.     Cervical back: Normal range of motion and neck supple.  Skin:    General: Skin is warm and dry.     Capillary Refill: Capillary refill takes less than 2 seconds.  Neurological:     General: No focal deficit present.     Mental Status: He is alert and oriented to person, place, and time.     Deep Tendon Reflexes: Reflexes normal.  Psychiatric:        Mood and Affect: Mood  normal.        Behavior: Behavior normal.     ED Results / Procedures / Treatments   Labs (all labs ordered are listed, but only abnormal results are displayed) Labs Reviewed - No data to display  EKG None  Radiology No results found.  Procedures Procedures    Medications Ordered in ED Medications  cefTRIAXone (ROCEPHIN) injection 500 mg (500 mg Intramuscular Given 10/27/22 0412)  doxycycline (VIBRA-TABS) tablet 100 mg (100 mg Oral Given 10/27/22 0412)  metroNIDAZOLE (FLAGYL) tablet 2,000 mg (2,000 mg Oral Given 10/27/22 0410)  sterile water (preservative free) injection (1 mL  Given 10/27/22 6045)    ED Course/ Medical Decision Making/ A&P                             Medical Decision Making Patient informed by a partner that she tested positive for trichomonas   Amount and/or Complexity of  Data Reviewed External Data Reviewed: notes.    Details: Previous notes reviewed   Risk Prescription drug management. Risk Details: Well appearing.  Treated in the ED.  Informed to inform all sexual partners that he was treated for STIs,  no sexual activity until 7 days post treatment.  Follow up at the [facility] health department for recheck . Stable for discharge.      Final Clinical Impression(s) / ED Diagnoses Final diagnoses:  STD exposure  Trichomoniasis   Return for intractable cough, coughing up blood, fevers > 100.4 unrelieved by medication, shortness of breath, intractable vomiting, chest pain, shortness of breath, weakness, numbness, changes in speech, facial asymmetry, abdominal pain, passing out, Inability to tolerate liquids or food, cough, altered mental status or any concerns. No signs of systemic illness or infection. The patient is nontoxic-appearing on exam and vital signs are within normal limits.  I have reviewed the triage vital signs and the nursing notes. Pertinent labs & imaging results that were available during my care of the patient were reviewed by me and considered in my medical decision making (see chart for details). After history, exam, and medical workup I feel the patient has been appropriately medically screened and is safe for discharge home. Pertinent diagnoses were discussed with the patient. Patient was given return precautions Rx / DC Orders ED Discharge Orders          Ordered    doxycycline (VIBRAMYCIN) 100 MG capsule  2 times daily        10/27/22 0440              Orlander Norwood, MD 10/27/22 4098

## 2022-10-27 NOTE — ED Triage Notes (Signed)
Patient requesting STD screening , reports sexual partner tested positive for Trichomonas infection , mild penile itching and discharge .

## 2023-10-26 ENCOUNTER — Emergency Department
Admission: EM | Admit: 2023-10-26 | Discharge: 2023-10-27 | Disposition: A | Discharge status: Home / Self Care | Payer: Self-pay | Attending: Emergency Medicine | Admitting: Emergency Medicine

## 2023-10-26 ENCOUNTER — Other Ambulatory Visit: Payer: Self-pay

## 2023-10-26 DIAGNOSIS — T7840XA Allergy, unspecified, initial encounter: Secondary | ICD-10-CM | POA: Diagnosis not present

## 2023-10-26 MED ORDER — FAMOTIDINE IN NACL 20-0.9 MG/50ML-% IV SOLN
20.0000 mg | Freq: Once | INTRAVENOUS | Status: AC
  Administered 2023-10-26: 20 mg via INTRAVENOUS
  Filled 2023-10-26: qty 50

## 2023-10-26 MED ORDER — DIPHENHYDRAMINE HCL 50 MG/ML IJ SOLN
25.0000 mg | Freq: Once | INTRAMUSCULAR | Status: AC
  Administered 2023-10-26: 25 mg via INTRAVENOUS
  Filled 2023-10-26: qty 1

## 2023-10-26 MED ORDER — PREDNISONE 50 MG PO TABS: 50.0000 mg | ORAL_TABLET | Freq: Every day | ORAL | 0 refills | Status: AC

## 2023-10-26 MED ORDER — METHYLPREDNISOLONE SODIUM SUCC 125 MG IJ SOLR
125.0000 mg | Freq: Once | INTRAMUSCULAR | Status: AC
  Administered 2023-10-26: 125 mg via INTRAVENOUS
  Filled 2023-10-26: qty 2

## 2023-10-26 MED ORDER — EPINEPHRINE 0.3 MG/0.3ML IJ SOAJ: 0.3000 mg | INTRAMUSCULAR | 0 refills | Status: AC | PRN

## 2023-10-26 NOTE — ED Provider Notes (Signed)
 [facility] Provider Note   Event Date/Time   First MD Initiated Contact with Patient 10/26/23 2155     (approximate) History  Allergic Reaction  HPI Austin Cline is a 38 y.o. male with no stated past medical history not on any medications who presents complaining of swelling to bilateral eyes as well as hives over his entire body that occurred just prior to arrival and after using a new soap and deodorant.  Patient states that his symptoms he got out of the shower he began having burning to bilateral feet as well as bilateral underarms where he was using a new deodorant.  Patient has taken 50 mg of oral Benadryl approximately 15 minutes prior to arrival and has had no improvement of his symptoms. ROS: Patient currently denies any vision changes, tinnitus, difficulty speaking, facial droop, sore throat, chest pain, shortness of breath, abdominal pain, nausea/vomiting/diarrhea, dysuria, or weakness/numbness/paresthesias in any extremity   Physical Exam  Triage Vital Signs: ED Triage Vitals  Encounter Vitals Group     BP 10/26/23 2150 (!) 141/93     Systolic BP Percentile --      Diastolic BP Percentile --      Pulse Rate 10/26/23 2150 100     Resp 10/26/23 2150 20     Temp 10/26/23 2150 98.2 F (36.8 C)     Temp Source 10/26/23 2150 Oral     SpO2 10/26/23 2150 100 %     Weight 10/26/23 2150 217 lb (98.4 kg)     Height 10/26/23 2150 5\' 11"  (1.803 m)     Head Circumference --      Peak Flow --      Pain Score 10/26/23 2209 8     Pain Loc --      Pain Education --      Exclude from Growth Chart --    Most recent vital signs: Vitals:   10/26/23 2150 10/26/23 2207  BP: (!) 141/93 124/84  Pulse: 100 96  Resp: 20   Temp: 98.2 F (36.8 C)   SpO2: 100% 98%   General: Awake, oriented x4. CV:  Good peripheral perfusion. Resp:  Normal effort. Abd:  No distention. Other:  Middle-aged overweight African-American male resting comfortably in no acute distress.   Hives in bilateral axilla.  Bilateral periorbital swelling ED Results / Procedures / Treatments  Labs (all labs ordered are listed, but only abnormal results are displayed) Labs Reviewed - No data to display PROCEDURES: Critical Care performed: No Procedures MEDICATIONS ORDERED IN ED: Medications  famotidine (PEPCID) IVPB 20 mg premix (20 mg Intravenous New Bag/Given 10/26/23 2304)  diphenhydrAMINE (BENADRYL) injection 25 mg (25 mg Intravenous Given 10/26/23 2241)  methylPREDNISolone sodium succinate (SOLU-MEDROL) 125 mg/2 mL injection 125 mg (125 mg Intravenous Given 10/26/23 2242)   IMPRESSION / MDM / ASSESSMENT AND PLAN / ED COURSE  I reviewed the triage vital signs and the nursing notes.                             The patient is on the cardiac monitor to evaluate for evidence of arrhythmia and/or significant heart rate changes. Patient's presentation is most consistent with acute presentation with potential threat to life or bodily function. + Cutaneous hives/erythema No evidence of multiorgan involvement  Given history and exam, presentation most consistent with allergic reaction. I have low suspicion for toxic shock syndrome, anaphylaxis, asthma exacerbation, or drug toxicity. Rx: Prednisone   60mg  qday x3days, Benadryl 25mg  q8hr x3days Disposition: Discharge home with SRP. Follow up with PCP in 1-2 days.   FINAL CLINICAL IMPRESSION(S) / ED DIAGNOSES   Final diagnoses:  Allergic reaction, initial encounter   Rx / DC Orders   ED Discharge Orders          Ordered    predniSONE  (DELTASONE ) 50 MG tablet  Daily with breakfast        10/26/23 2320    EPINEPHrine 0.3 mg/0.3 mL IJ SOAJ injection  As needed        10/26/23 2320           Note:  This document was prepared using Dragon voice recognition software and may include unintentional dictation errors.   Ziyon Soltau K, MD 10/26/23 [phone]

## 2023-10-26 NOTE — ED Notes (Signed)
 Pt feels like is "losing hearing"

## 2023-10-26 NOTE — ED Notes (Signed)
 ED Provider at bedside.

## 2023-10-26 NOTE — ED Triage Notes (Addendum)
 Arrived pov for allergic reaction started around 2000 unsure of cause  Per pt "I thought I had a bug bite on my right shoulder and haven't stopped itching since, tried taking a cold shower and two benadryl with no relief."  Pt has hives to face, chest, back, bilateral arms, eyes red and swollen, difficulty hearing out left ear  Denies shob, or throat evolvement

## 2023-10-26 NOTE — Discharge Instructions (Addendum)
 Please use Benadryl 25 mg every 8 hours for the next 3 days.

## 2024-02-07 ENCOUNTER — Emergency Department
Admission: EM | Admit: 2024-02-07 | Discharge: 2024-02-07 | Disposition: A | Discharge status: Home / Self Care | Attending: Emergency Medicine | Admitting: Emergency Medicine

## 2024-02-07 ENCOUNTER — Emergency Department

## 2024-02-07 ENCOUNTER — Encounter: Payer: Self-pay | Admitting: Emergency Medicine

## 2024-02-07 ENCOUNTER — Other Ambulatory Visit: Payer: Self-pay

## 2024-02-07 DIAGNOSIS — S6992XA Unspecified injury of left wrist, hand and finger(s), initial encounter: Secondary | ICD-10-CM | POA: Diagnosis not present

## 2024-02-07 DIAGNOSIS — W231XXA Caught, crushed, jammed, or pinched between stationary objects, initial encounter: Secondary | ICD-10-CM | POA: Insufficient documentation

## 2024-02-07 DIAGNOSIS — S62633A Displaced fracture of distal phalanx of left middle finger, initial encounter for closed fracture: Secondary | ICD-10-CM | POA: Insufficient documentation

## 2024-02-07 DIAGNOSIS — M79645 Pain in left finger(s): Secondary | ICD-10-CM | POA: Diagnosis not present

## 2024-02-07 MED ORDER — ONDANSETRON 4 MG PO TBDP: 4.0000 mg | ORAL_TABLET | Freq: Four times a day (QID) | ORAL | 0 refills | Status: AC | PRN

## 2024-02-07 MED ORDER — OXYCODONE HCL 5 MG PO TABS: 5.0000 mg | ORAL_TABLET | Freq: Three times a day (TID) | ORAL | 0 refills | Status: AC | PRN

## 2024-02-07 MED ORDER — ONDANSETRON 4 MG PO TBDP
4.0000 mg | ORAL_TABLET | Freq: Once | ORAL | Status: AC
  Administered 2024-02-07: 4 mg via ORAL
  Filled 2024-02-07: qty 1

## 2024-02-07 MED ORDER — IBUPROFEN 800 MG PO TABS: 800.0000 mg | ORAL_TABLET | Freq: Three times a day (TID) | ORAL | 0 refills | Status: AC | PRN

## 2024-02-07 MED ORDER — IBUPROFEN 800 MG PO TABS
800.0000 mg | ORAL_TABLET | Freq: Once | ORAL | Status: AC
  Administered 2024-02-07: 800 mg via ORAL
  Filled 2024-02-07: qty 1

## 2024-02-07 MED ORDER — OXYCODONE-ACETAMINOPHEN 5-325 MG PO TABS
1.0000 | ORAL_TABLET | Freq: Once | ORAL | Status: AC
  Administered 2024-02-07: 1 via ORAL
  Filled 2024-02-07: qty 1

## 2024-02-07 NOTE — ED Provider Notes (Signed)
 [facility] Provider Note    Event Date/Time   First MD Initiated Contact with Patient 02/07/24 0230     (approximate)   History   Finger Injury   HPI  Austin Cline is a 38 y.o. male right-hand-dominant male with no significant past medical history who presents finger pain and occurred.  States he was moving [employer] and his finger got caught in between the wall and the piece of furniture.  Has bruising and swelling to the fingertip.  Has pain with movement of this finger.  Denies any other injury.   History provided by patient.    History reviewed. No pertinent past medical history.  Past Surgical History:  Procedure Laterality Date   HERNIA REPAIR      MEDICATIONS:  Prior to Admission medications   Medication Sig Start Date End Date Taking? Authorizing Provider  doxycycline  (VIBRAMYCIN ) 100 MG capsule Take 1 capsule (100 mg total) by mouth 2 (two) times daily. One po bid x 7 days 10/27/22   Palumbo, April, MD  EPINEPHrine  0.3 mg/0.3 mL IJ SOAJ injection Inject 0.3 mg into the muscle as needed for anaphylaxis. 10/26/23   Bradler, Evan K, MD  meloxicam  (MOBIC ) 15 MG tablet Take 1 tablet (15 mg total) by mouth daily. 12/06/21   Mesner, Selinda, MD  methocarbamol  (ROBAXIN ) 500 MG tablet Take 1 tablet (500 mg total) by mouth 2 (two) times daily. 12/06/21   Mesner, Selinda, MD    Physical Exam   Triage Vital Signs: ED Triage Vitals  Encounter Vitals Group     BP 02/07/24 0209 (!) 142/107     Girls Systolic BP Percentile --      Girls Diastolic BP Percentile --      Boys Systolic BP Percentile --      Boys Diastolic BP Percentile --      Pulse Rate 02/07/24 0209 92     Resp 02/07/24 0209 18     Temp 02/07/24 0209 98.6 F (37 C)     Temp Source 02/07/24 0209 Oral     SpO2 02/07/24 0209 97 %     Weight 02/07/24 0210 226 lb (102.5 kg)     Height 02/07/24 0210 5' 11 (1.803 m)     Head Circumference --      Peak Flow --      Pain Score  02/07/24 0209 10     Pain Loc --      Pain Education --      Exclude from Growth Chart --     Most recent vital signs: Vitals:   02/07/24 0209  BP: (!) 142/107  Pulse: 92  Resp: 18  Temp: 98.6 F (37 C)  SpO2: 97%     CONSTITUTIONAL: Alert and responds appropriately to questions. Well-appearing; well-nourished HEAD: Normocephalic, atraumatic EYES: Conjunctivae clear, pupils appear equal ENT: normal nose; moist mucous membranes NECK: Normal range of motion CARD: Regular rate and rhythm RESP: Normal chest excursion without splinting or tachypnea; no hypoxia or respiratory distress, speaking full sentences ABD/GI: non-distended EXT: Bruising, soft tissue swelling, tenderness noted to the distal aspect of the left third digit.  Normal capillary refill.  No subungual hematoma.  Nail appears intact.  2+ left radial pulse.  Otherwise fingers of the left hand, metacarpals are nontender to palpation. SKIN: Normal color for age and race, no rashes on exposed skin NEURO: Moves all extremities equally, normal speech, no facial asymmetry noted PSYCH: The patient's mood and manner are appropriate.  Grooming and personal hygiene are appropriate.  ED Results / Procedures / Treatments   LABS: (all labs ordered are listed, but only abnormal results are displayed) Labs Reviewed - No data to display   EKG:    RADIOLOGY: My personal review and interpretation of imaging: Left distal third finger fracture.  I have personally reviewed all radiology reports. DG Finger Middle Left Result Date: 02/07/2024 CLINICAL DATA:  Crush injury to the third digit, initial encounter EXAM: LEFT MIDDLE FINGER 2+V COMPARISON:  None Available. FINDINGS: Oblique fracture through the base of the third distal phalanx extending into the articular surface. Generalized soft tissue swelling is noted. No other focal abnormality is seen. IMPRESSION: Oblique fracture through the base of the third distal phalanx with  associated soft tissue swelling. Electronically Signed   By: Oneil Devonshire M.D.   On: 02/07/2024 02:42     PROCEDURES:  Critical Care performed: No   SPLINT APPLICATION  Authorized by: Josette Jadesola Poynter Consent: Verbal consent obtained. Risks and benefits: risks, benefits and alternatives were discussed Consent given by: patient Splint applied by: nurse Location details: Left third finger Splint type: Aluminum finger splint Supplies used: Aluminum finger splint Post-procedure: The splinted body part was neurovascularly unchanged following the procedure. Patient tolerance: Patient tolerated the procedure well with no immediate complications.     Procedures    IMPRESSION / MDM / ASSESSMENT AND PLAN / ED COURSE  I reviewed the triage vital signs and the nursing notes.   Patient here with distal left third finger injury.     DIFFERENTIAL DIAGNOSIS (includes but not limited to):   Fracture, contusion, no sign of subungual hematoma, no open wound, no signs of cellulitis, flexor tenosynovitis, pulpitis, felon, herpetic whitlow  Patient's presentation is most consistent with acute complicated illness / injury requiring diagnostic workup.  PLAN: X-ray of the left finger reviewed and interpreted by myself and the radiologist and shows displaced distal left phalanx fracture.  Will place him in an aluminum finger splint.  Will provide with pain medication.  Will give orthopedic follow-up as needed given he reports he feels like he cannot move the finger due to pain.  Low suspicion clinically for ligamentous or tendon injury given mechanism of injury today.  No subungual hematoma or nail injury on exam.  Neurovascular intact distally.   MEDICATIONS GIVEN IN ED: Medications  ibuprofen  (ADVIL ) tablet 800 mg (800 mg Oral Given 02/07/24 0249)  oxyCODONE -acetaminophen  (PERCOCET/ROXICET) 5-325 MG per tablet 1 tablet (1 tablet Oral Given 02/07/24 0249)  ondansetron  (ZOFRAN -ODT) disintegrating  tablet 4 mg (4 mg Oral Given 02/07/24 0249)     ED COURSE:  At this time, I do not feel there is any life-threatening condition present. I reviewed all nursing notes, vitals, pertinent previous records.  All lab and urine results, EKGs, imaging ordered have been independently reviewed and interpreted by myself.  I reviewed all available radiology reports from any imaging ordered this visit.  Based on my assessment, I feel the patient is safe to be discharged home without further emergent workup and can continue workup as an outpatient as needed. Discussed all findings, treatment plan as well as usual and customary return precautions.  They verbalize understanding and are comfortable with this plan.  Outpatient follow-up has been provided as needed.  All questions have been answered.    CONSULTS:  none   OUTSIDE RECORDS REVIEWED: No prior records for review.     FINAL CLINICAL IMPRESSION(S) / ED DIAGNOSES   Final diagnoses:  Closed  displaced fracture of distal phalanx of left middle finger, initial encounter     Rx / DC Orders   ED Discharge Orders          Ordered    oxyCODONE  (ROXICODONE ) 5 MG immediate release tablet  Every 8 hours PRN        02/07/24 0310    ibuprofen  (ADVIL ) 800 MG tablet  Every 8 hours PRN        02/07/24 0310    ondansetron  (ZOFRAN -ODT) 4 MG disintegrating tablet  Every 6 hours PRN        02/07/24 0310             Note:  This document was prepared using Dragon voice recognition software and may include unintentional dictation errors.   Gola Bribiesca, Josette SAILOR, DO 02/07/24 [phone]

## 2024-02-07 NOTE — ED Triage Notes (Signed)
 Pt arrives via POV c/o left middle finger injury. Pt was moving furniture tonight and finger was caught between wall and piece of furniture. Pt reports he is not able to move finger and it is very painful. Swell noted to affected finger, no obvious deformity noted.

## 2024-02-07 NOTE — Discharge Instructions (Addendum)
 You may alternate over the counter Tylenol  1000 mg every 6 hours as needed for pain, fever and Ibuprofen  800 mg every 6-8 hours as needed for pain, fever.  Please take Ibuprofen  with food.  Do not take more than 4000 mg of Tylenol  (acetaminophen ) in a 24 hour period.   You are being provided a prescription for opiates (also known as narcotics) for pain control.  Opiates can be addictive and should only be used when absolutely necessary for pain control when other alternatives do not work.  We recommend you only use them for the recommended amount of time and only as prescribed.  Please do not take with other sedative medications or alcohol.  Please do not drive, operate machinery, make important decisions while taking opiates.  Please note that these medications can be addictive and have high abuse potential.  Patients can become addicted to narcotics after only taking them for a few days.  Please keep these medications locked away from children, teenagers or any family members with history of substance abuse.  Narcotic pain medicine may also make you constipated.  You may use over-the-counter medications such as MiraLAX, Colace to prevent constipation.  If you become constipated, you may use over-the-counter enemas as needed.  Itching and nausea are also common side effects of narcotic pain medication.  If you develop uncontrolled vomiting or a rash, please stop these medications and seek medical care.  I recommend wearing your finger splint for the next 4 weeks.  You may follow-up with orthopedics as needed but this is normally treated with conservative management.  You may take your splint off to bathe but please put it back on afterwards.
